# Patient Record
Sex: Female | Born: 1995
Health system: Southern US, Community
[De-identification: ages and names within clinical notes are randomized; demographics above are authoritative.]

## PROBLEM LIST (undated history)

## (undated) DIAGNOSIS — E079 Disorder of thyroid, unspecified: Secondary | ICD-10-CM

## (undated) DIAGNOSIS — F419 Anxiety disorder, unspecified: Secondary | ICD-10-CM

## (undated) DIAGNOSIS — F988 Other specified behavioral and emotional disorders with onset usually occurring in childhood and adolescence: Secondary | ICD-10-CM

## (undated) HISTORY — DX: Anxiety disorder, unspecified: F41.9

## (undated) HISTORY — PX: WISDOM TOOTH EXTRACTION: SHX21

## (undated) HISTORY — DX: Disorder of thyroid, unspecified: E07.9

## (undated) HISTORY — DX: Other specified behavioral and emotional disorders with onset usually occurring in childhood and adolescence: F98.8

---
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
## 2017-01-13 DIAGNOSIS — F4010 Social phobia, unspecified: Secondary | ICD-10-CM | POA: Diagnosis not present

## 2017-02-19 DIAGNOSIS — F4010 Social phobia, unspecified: Secondary | ICD-10-CM | POA: Diagnosis not present

## 2017-02-26 DIAGNOSIS — B354 Tinea corporis: Secondary | ICD-10-CM | POA: Diagnosis not present

## 2017-03-26 DIAGNOSIS — F4010 Social phobia, unspecified: Secondary | ICD-10-CM | POA: Diagnosis not present

## 2017-03-31 DIAGNOSIS — L818 Other specified disorders of pigmentation: Secondary | ICD-10-CM | POA: Diagnosis not present

## 2017-05-21 DIAGNOSIS — F4010 Social phobia, unspecified: Secondary | ICD-10-CM | POA: Diagnosis not present

## 2017-05-31 DIAGNOSIS — F4010 Social phobia, unspecified: Secondary | ICD-10-CM | POA: Diagnosis not present

## 2017-06-10 DIAGNOSIS — F4010 Social phobia, unspecified: Secondary | ICD-10-CM | POA: Diagnosis not present

## 2017-06-24 DIAGNOSIS — F4010 Social phobia, unspecified: Secondary | ICD-10-CM | POA: Diagnosis not present

## 2017-07-01 DIAGNOSIS — F4010 Social phobia, unspecified: Secondary | ICD-10-CM | POA: Diagnosis not present

## 2017-10-18 ENCOUNTER — Ambulatory Visit (INDEPENDENT_AMBULATORY_CARE_PROVIDER_SITE_OTHER): Payer: 59 | Admitting: Mental Health

## 2017-10-18 DIAGNOSIS — F411 Generalized anxiety disorder: Secondary | ICD-10-CM | POA: Diagnosis not present

## 2017-10-18 NOTE — Progress Notes (Signed)
      [facility] Counselor/Therapist Progress Note   Patient ID: Michaela Pena, MRN: [id number]  Date: 10/18/2017  Timespent: 58 minutes  Treatment Type: Individual  Subjective: Patient arrived on time for today's session.  She shared progress and events since last visit.  She states she was able to visit family in [place] and shared experiences.  She stated that she has struggled with decreasing her obsessive compulsive behavior while driving, which is to stop and check her gas tank at least 2 times on the way to work or other destinations.  We reviewed coping strategies to continue to decrease these behaviors.  Continue to assessed relevant history related to her anxiety.  Patient shared more history related to her relationship with her father.  She shared how he can get upset and gave an example recently in which she and her sister left his home.  She stated that they typically visit every Friday evening for dinner she stated that she plans to set some boundaries, not visit as often as she typically feels "on edge" while there.  Patient was able to process and identify the relationship between this relationship and her own levels of anxiety.  Interventions:CBT, Solution Focused and Strength-based  Mental Status Exam:   Appearance:   Casual     Behavior:  Appropriate  Motor:  Normal  Speech/Language:   Clear and Coherent  Affect:  Congruent  Mood:  euthymic  Thought process:  Coherent and Relevant  Thought content:    WDL  Perceptual disturbances:    Normal  Orientation:  Full (Time, Place, and Person)  Attention:  Good  Concentration:  good  Memory:  Immediate  Fund of knowledge:   Good  Insight:    Good  Judgment:   Good  Impulse Control:  good    Reported Symptoms: Daily anxiety, obsessive thoughts, compulsive behavior, panic attacks  Risk Assessment: Danger to Self:  No Self-injurious Behavior: No Danger to Others: No Duty to Warn:no Physical Aggression / Violence:No   Access to Firearms a concern: No  Gang Involvement:No   Diagnosis:   ICD-10-CM   1. Generalized anxiety disorder F41.1      Plan:   1.  Patient to continue to engage in individual counseling 2-4 times a month or as needed. 2.  Patient to identify and apply CBT, coping skills learned in session to decrease anxiety symptoms. 3.  Patient to contact this office, go to the local ED or call 911 if a crisis or emergency develops between visits.  Waldron Session, [facility]

## 2017-11-03 ENCOUNTER — Ambulatory Visit: Payer: 59 | Admitting: Mental Health

## 2017-11-03 DIAGNOSIS — F411 Generalized anxiety disorder: Secondary | ICD-10-CM

## 2017-11-03 NOTE — Progress Notes (Signed)
            [facility] Counselor/Therapist Progress Note   Patient ID: Michaela Pena, MRN: [id number]  Date: 11/03/2017  Timespent: 58 minutes  Treatment Type: Individual  Subjective: Patient arrived on time for today's session.  She shared progress and events since last visit.  She states she was able to visit family in [place] as planned.  She shared experiences with family which centered around their religious beliefs.  She went on to discuss the relationship she continues to have with her father.  She stated that they have not spoken about the incident that occurred his in his house about 2 weeks ago.  She shared she does not expect him to engage her in a discussion, as he often does not talk about relationship issues especially when they are has been any problems.  She shared her thoughts about setting boundaries with him we discussed ways she can communicate and share expectations.  She continues to struggle most days with checking behavior related to her car when driving.  She appears to have some motivation to change however, she appears to lack consistency.  We discussed other coping she employs in the car, how she does not turn on the radio for fear of not hearing something important.  Encouraged her to try other behaviors such as turning on the radio at a normal volume level to assist her in creating new behaviors and lower her anxiety.  She Interventions:CBT, Solution Focused and Strength-based  Mental Status Exam:   Appearance:   Casual     Behavior:  Appropriate  Motor:  Normal  Speech/Language:   Clear and Coherent  Affect:  Congruent  Mood:  euthymic  Thought process:  Coherent and Relevant  Thought content:    WDL  Perceptual disturbances:    Normal  Orientation:  Full (Time, Place, and Person)  Attention:  Good  Concentration:  good  Memory:  Immediate  Fund of knowledge:   Good  Insight:    Good  Judgment:   Good  Impulse Control:  good     Reported Symptoms: Daily anxiety, obsessive thoughts, compulsive behavior, panic attacks  Risk Assessment: Danger to Self:  No Self-injurious Behavior: No Danger to Others: No Duty to Warn:no Physical Aggression / Violence:No  Access to Firearms a concern: No  Gang Involvement:No   Diagnosis:   ICD-10-CM   1. Generalized anxiety disorder F41.1      Plan:   1.  Patient to continue to engage in individual counseling 2-4 times a month or as needed. 2.  Patient to identify and apply CBT, coping skills learned in session to decrease anxiety symptoms. 3.  Patient to contact this office, go to the local ED or call 911 if a crisis or emergency develops between visits.  Waldron Session, [facility]

## 2017-11-16 ENCOUNTER — Encounter: Payer: Self-pay | Admitting: Emergency Medicine

## 2017-11-16 DIAGNOSIS — F411 Generalized anxiety disorder: Secondary | ICD-10-CM

## 2017-11-17 ENCOUNTER — Ambulatory Visit: Payer: 59 | Admitting: Mental Health

## 2017-11-17 DIAGNOSIS — F411 Generalized anxiety disorder: Secondary | ICD-10-CM | POA: Diagnosis not present

## 2017-11-17 NOTE — Progress Notes (Signed)
     [facility] Counselor/Therapist Progress Note   Patient ID: Michaela Pena, MRN: [id number]  Date: 11/17/2017  Timespent: 58 minutes  Treatment Type: Individual  Mental Status Exam:   Appearance:   Casual     Behavior:  Appropriate  Motor:  Normal  Speech/Language:   Clear and Coherent  Affect:  Anxious, pleasant  Mood:  Full range, congruent  Thought process:  Coherent and Relevant, goal directed  Thought content:    WDL, no SI/HI  Perceptual disturbances:    none  Orientation:  Full (Time, Place, and Person)  Attention:  Good  Concentration:  good  Memory:  WNL  Fund of knowledge:   Good  Insight:    Good  Judgment:   Good  Impulse Control:  good    Reported Symptoms: Daily anxiety, obsessive thoughts, compulsive behavior, panic attacks  Risk Assessment: Danger to Self:  No Self-injurious Behavior: No Danger to Others: No Duty to Warn:no Physical Aggression / Violence:No  Access to Firearms a concern: No  Gang Involvement:No   Patient / guardian was educated about steps to take if suicide or homicide risk level increases between visits. While future psychiatric events cannot be accurately predicted, the patient does not currently require acute inpatient psychiatric care and does not currently meet [facility] involuntary commitment criteria.  Subjective: Patient arrived on time for today's session.  Discussed progress. She stated she has been doing well at school and work. She shared how she has not gone to her father's home recently.  She stated that she also does not plan to visit any time soon due to the recent issue that occurred when she and her sister were visiting him.  She stated that she continues to cope with anxiety particularly when driving as she continues to stop to check her gas cap At least 2 times on the trips.  We reviewed some past session content related to when she was away at college last year.  Some specifics around triggers for her  rising anxiety were explored.  She identified how she had minimal friends and was staying in her dorm room often, avoiding social contact.  Patient has difficulty in social situations but continues to provide minimal feedback regarding thoughts that occur during these anxiety producing moments.  Assisted her in identifying self talk.  Encouraged her to continue her homework assignments related to decreasing stopped times while driving.  We discussed the significance of her taking medications consistently as she stated that she does not take them nearly often as she should.  We discussed the potential gain of taking her medication to treat her anxiety and encouraged her to discuss with Corie Chiquito, NP at this practice at her next medication management visit.  Interventions: CBT, Solution Focused and Strength-based  Diagnosis:    ICD-10-CM   1. Generalized anxiety disorder F41.1     Plan:   1.  Patient to continue to engage in individual counseling 2-4 times a month or as needed. 2.  Patient to identify and apply CBT, coping skills learned in session to decrease anxiety symptoms. 3.  Patient to contact this office, go to the local ED or call 911 if a crisis or emergency develops between visits.  Waldron Session, [facility]

## 2017-11-29 ENCOUNTER — Ambulatory Visit: Payer: 59 | Admitting: Mental Health

## 2017-11-29 DIAGNOSIS — F411 Generalized anxiety disorder: Secondary | ICD-10-CM | POA: Diagnosis not present

## 2017-11-29 NOTE — Progress Notes (Signed)
     [facility] Counselor/Therapist Progress Note   Patient ID: Michaela Pena, MRN: [id number]  Date: 11/29/2017  Timespent: 58 minutes  Treatment Type: Individual  Mental Status Exam:   Appearance:   Casual     Behavior:  Appropriate  Motor:  Normal  Speech/Language:   Clear and Coherent  Affect:  Anxious, pleasant  Mood:  Full range, congruent  Thought process:  Coherent and Relevant, goal directed  Thought content:    WDL, no SI/HI  Perceptual disturbances:    none  Orientation:  Full (Time, Place, and Person)  Attention:  Good  Concentration:  good  Memory:  WNL  Fund of knowledge:   Good  Insight:    Good  Judgment:   Good  Impulse Control:  good    Reported Symptoms: Daily anxiety, obsessive thoughts, compulsive behavior, panic attacks  Risk Assessment: Danger to Self:  No Self-injurious Behavior: No Danger to Others: No Duty to Warn:no Physical Aggression / Violence:No  Access to Firearms a concern: No  Gang Involvement:No   Patient / guardian was educated about steps to take if suicide or homicide risk level increases between visits. While future psychiatric events cannot be accurately predicted, the patient does not currently require acute inpatient psychiatric care and does not currently meet [facility]  involuntary commitment criteria.  Subjective: Patient arrived on time for today's session.  Discussed progress.  She stated that she continues to struggle with her anxiety, continues compulsive behavior going over to check her gas At least 2 times per trip.  She stated that she has not attempted to work on homework exercises recently.  She appears to continue to avoid these efforts as her anxiety has persisted.  She has a coming appointment with Corie ChiquitoJessica Carter, NP at this practice.  We encouraged her to take her medication as prescribed as she continues to not be consistent with taking it.  We discussed ways to improve organization, taking at the same  time every day, setting reminders on her phone.  She stated she plans to begin increasing her organization after our discussion today.  She stated that she is willing to take her medication, but she forgets.  She identified other anxiety producing situations, riding elevators specifically.  She stated she takes the stairs often when she comes to her appointment due to it being on the fourth floor.  We discussed the possibility of her making an attempt to ride down following the session, however she declined.  Encouraged her to discuss any medication concerns or questions at her coming appointment.  Continue to work with her from a strength-based approach, support and encouragement were provided throughout as well as solution focused strategies reinforced.  Interventions: CBT, Solution Focused and Strength-based  Diagnosis:    ICD-10-CM   1. Generalized anxiety disorder F41.1     Plan:   1.  Patient to continue to engage in individual counseling 2-4 times a month or as needed. 2.  Patient to identify and apply CBT, coping skills learned in session to decrease anxiety symptoms. 3.  Patient to contact this office, go to the local ED or call 911 if a crisis or emergency develops between visits.  Waldron Sessionhristopher Marylen Zuk, [facility]

## 2017-12-03 ENCOUNTER — Ambulatory Visit: Payer: Self-pay | Admitting: Psychiatry

## 2017-12-13 ENCOUNTER — Ambulatory Visit: Payer: 59 | Admitting: Mental Health

## 2017-12-13 DIAGNOSIS — F411 Generalized anxiety disorder: Secondary | ICD-10-CM

## 2017-12-26 NOTE — Progress Notes (Signed)
     [facility] Counselor/Therapist Progress Note   Patient ID: Michaela Pena, MRN: [id number]  Date: 12/26/2017  Timespent: 58 minutes  Treatment Type: Individual  Mental Status Exam:   Appearance:   Casual     Behavior:  Appropriate  Motor:  Normal  Speech/Language:   Clear and Coherent  Affect:  Anxious, pleasant  Mood:  Full range, congruent  Thought process:  Coherent and Relevant, goal directed  Thought content:    WDL, no SI/HI  Perceptual disturbances:    none  Orientation:  Full (Time, Place, and Person)  Attention:  Good  Concentration:  good  Memory:  WNL  Fund of knowledge:   Good  Insight:    Good  Judgment:   Good  Impulse Control:  good    Reported Symptoms: Daily anxiety, obsessive thoughts, compulsive behavior, panic attacks  Risk Assessment: Danger to Self:  No Self-injurious Behavior: No Danger to Others: No Duty to Warn:no Physical Aggression / Violence:No  Access to Firearms a concern: No  Gang Involvement:No   Patient / guardian was educated about steps to take if suicide or homicide risk level increases between visits. While future psychiatric events cannot be accurately predicted, the patient does not currently require acute inpatient psychiatric care and does not [facility] meet [facility] involuntary [facility] criteria.  Subjective: Patient arrived on time for today's session.  Discussed progress over the last 2 weeks.  She shared how she spend time w/ [place]. She stated she saw her father, that they had some interactions but did not talk much, and not about their recent disagreement.  She continues to cope w/ anxiety most days, continues checking behaviors. Encouraged her to take her medications consistently, some problems solving was implemented due to her not having a clearly defined routine to take the medication and ultimately forgets. She expressed wanting to take the medication consistently in the  hope that it will ease some of her anxiety. Continued to work with her from a strength-based approach, support and encouragement were provided throughout as well as solution focused strategies reinforced.  Interventions: CBT, Solution Focused and Strength-based  Diagnosis:    ICD-10-CM   1. Generalized anxiety disorder F41.1     Plan:   1.  Patient to continue to engage in individual counseling 2-4 times a month or as needed. 2.  Patient to identify and apply CBT, coping skills learned in session to decrease anxiety symptoms. 3.  Patient to contact this office, go to the local ED or call 911 if a crisis or emergency develops between visits.  Waldron Sessionhristopher Annsley Akkerman, [facility]

## 2017-12-27 ENCOUNTER — Ambulatory Visit: Payer: 59 | Admitting: Psychiatry

## 2017-12-27 ENCOUNTER — Ambulatory Visit: Payer: 59 | Admitting: Mental Health

## 2017-12-27 ENCOUNTER — Encounter: Payer: Self-pay | Admitting: Psychiatry

## 2017-12-27 VITALS — BP 103/67 | HR 109

## 2017-12-27 DIAGNOSIS — F3342 Major depressive disorder, recurrent, in full remission: Secondary | ICD-10-CM

## 2017-12-27 DIAGNOSIS — F900 Attention-deficit hyperactivity disorder, predominantly inattentive type: Secondary | ICD-10-CM | POA: Diagnosis not present

## 2017-12-27 DIAGNOSIS — F411 Generalized anxiety disorder: Secondary | ICD-10-CM

## 2017-12-27 MED ORDER — AMPHETAMINE-DEXTROAMPHET ER 15 MG PO CP24: 15.0000 mg | ORAL_CAPSULE | ORAL | 0 refills | Status: DC

## 2017-12-27 MED ORDER — BUPROPION HCL ER (XL) 150 MG PO TB24: 150.0000 mg | ORAL_TABLET | ORAL | 1 refills | Status: DC

## 2017-12-27 MED ORDER — SERTRALINE HCL 100 MG PO TABS: 200.0000 mg | ORAL_TABLET | Freq: Every day | ORAL | 1 refills | Status: DC

## 2017-12-27 NOTE — Progress Notes (Signed)
     [facility] Counselor/Therapist Progress Note   Patient ID: Michaela Pena, MRN: [id number]  Date: 12/27/2017  Timespent: 56 minutes  Treatment Type: Individual  Mental Status Exam:   Appearance:   Casual     Behavior:  Appropriate  Motor:  Normal  Speech/Language:   Clear and Coherent  Affect:  Anxious, pleasant  Mood:  Full range, congruent  Thought process:  Coherent and Relevant, goal directed  Thought content:    WDL, no SI/HI  Perceptual disturbances:    none  Orientation:  Full (Time, Place, and Person)  Attention:  Good  Concentration:  good  Memory:  WNL  Fund of knowledge:   Good  Insight:    Good  Judgment:   Good  Impulse Control:  good    Reported Symptoms: Daily anxiety, obsessive thoughts, compulsive behavior, panic attacks  Risk Assessment: Danger to Self:  No Self-injurious Behavior: No Danger to Others: No Duty to Warn:no Physical Aggression / Violence:No  Access to Firearms a concern: No  Gang Involvement:No   Patient / guardian was educated about steps to take if suicide or homicide risk level increases between visits. While future psychiatric events cannot be accurately predicted, the patient does not currently require acute inpatient psychiatric care and does not currently meet [facility] involuntary commitment criteria.  Subjective: Patient arrived on time for today's session.  Discussed progress.  She stated that she continues to struggle with checking behavior while driving, having to stop at least 2 times on most of her trips occurring during the days.  Continue to challenge patient, gave her homework assignment to stop at her second stop only.  Discussed with her, questioning her rationale for stopping in which she struggled to validate.  She stated that she has taken her medications more often but continues to be inconsistent.  We reviewed ways to get organized for consistency for potential benefits.  She shared more history  related to her father, she stated that he will not call her unless she calls him which is typical for him and how he communicates with her sisters.  She shared how he may have had a more stressful childhood as his mother was minimally nurturing and very strict.   Continue to work with patient from a strength-based cognitive behavioral approach.  Interventions: CBT, Solution Focused and Strength-based  Diagnosis:    ICD-10-CM   1. Generalized anxiety disorder F41.1     Plan:   1.  Patient to continue to engage in individual counseling 2-4 times a month or as needed. 2.  Patient to identify and apply CBT, coping skills learned in session to decrease anxiety symptoms. 3.  Patient to contact this office, go to the local ED or call 911 if a crisis or emergency develops between visits.  Waldron Sessionhristopher Mandrell Vangilder, [facility]

## 2017-12-27 NOTE — Progress Notes (Signed)
Ryder Chesmore [id number] January 14, 1995 22 y.o.  Subjective:   Patient ID:  Michaela Pena is a 22 y.o. (DOB 1995/05/29) female.  Chief Complaint:  Chief Complaint  Patient presents with  . Follow-up    Anxiety, ADD, h/o depression    HPI Michaela Pena presents to the office today for follow-up of anxiety and depression. Reports that she has difficulty remembering to take her medications. She reports that medication is effective when she is taking it regularly. Reports that she has some anxiety and mild panic s/s due to acute stressors. Denies depressed mood. She reports that she continues to check things frequently and has not noticed a decrease in checking behaviors. Sleeping well. Denies change in appetite. Denies low energy or motivation. Reports concentration and focus have been "ok" and notice decreased concentration without Adderall XR. Reports that she has to give herself breaks to help with concentration. Denies SI.   Past Psychiatric Medication Trials: Wellbutrin XL Sertraline Adderall XR Adderall immediate release   Review of Systems:  Review of Systems  Cardiovascular: Negative for palpitations.  Musculoskeletal: Negative for gait problem.  Neurological: Negative for tremors.  Psychiatric/Behavioral:       Please refer to HPI    Medications: I have reviewed the patient's current medications.  Current Outpatient Medications  Medication Sig Dispense Refill  . buPROPion (WELLBUTRIN XL) 150 MG 24 hr tablet Take 1 tablet (150 mg total) by mouth every morning. 90 tablet 1  . cholecalciferol (VITAMIN D3) 25 MCG (1000 UT) tablet Take 1,000 Units by mouth daily.    . sertraline (ZOLOFT) 100 MG tablet Take 2 tablets (200 mg total) by mouth daily. 2 po qd 180 tablet 1  . amphetamine-dextroamphetamine (ADDERALL XR) 15 MG 24 hr capsule Take 1 capsule by mouth every morning. 30 capsule 0  . [START ON 01/24/2018] amphetamine-dextroamphetamine (ADDERALL XR)  15 MG 24 hr capsule Take 1 capsule by mouth every morning. 30 capsule 0  . [START ON 02/21/2018] amphetamine-dextroamphetamine (ADDERALL XR) 15 MG 24 hr capsule Take 1 capsule by mouth every morning. 30 capsule 0   No current facility-administered medications for this visit.     Medication Side Effects: None  Allergies: No Known Allergies  History reviewed. No pertinent past medical history.  Family History  Problem Relation Age of Onset  . Anxiety disorder Mother   . Depression Mother   . Anxiety disorder Maternal Grandmother   . Depression Maternal Grandmother     Social History   Socioeconomic History  . Marital status: Unknown    Spouse name: Not on file  . Number of children: Not on file  . Years of education: Not on file  . Highest education level: Not on file  Occupational History  . Not on file  Social Needs  . Financial resource strain: Not on file  . Food insecurity:    Worry: Not on file    Inability: Not on file  . Transportation needs:    Medical: Not on file    Non-medical: Not on file  Tobacco Use  . Smoking status: Never Smoker  . Smokeless tobacco: Never Used  Substance and Sexual Activity  . Alcohol use: Not on file  . Drug use: Not on file  . Sexual activity: Not on file  Lifestyle  . Physical activity:    Days per week: Not on file    Minutes per session: Not on file  . Stress: Not on file  Relationships  . Social connections:  Talks on phone: Not on file    Gets together: Not on file    Attends religious service: Not on file    Active member of club or organization: Not on file    Attends meetings of clubs or organizations: Not on file    Relationship status: Not on file  . Intimate partner violence:    Fear of current or ex partner: Not on file    Emotionally abused: Not on file    Physically abused: Not on file    Forced sexual activity: Not on file  Other Topics Concern  . Not on file  Social History Narrative  . Not on file     Past Medical History, Surgical history, Social history, and Family history were reviewed and updated as appropriate.   Please see review of systems for further details on the patient's review from today.   Objective:   Physical Exam:  BP 103/67   Pulse (!) 109   Physical Exam Constitutional:      General: She is not in acute distress.    Appearance: She is well-developed.  Musculoskeletal:        General: No deformity.  Neurological:     Mental Status: She is alert and oriented to person, place, and time.     Coordination: Coordination normal.  Psychiatric:        Mood and Affect: Mood is anxious. Mood is not depressed. Affect is not labile, blunt, angry or inappropriate.        Speech: Speech normal.        Behavior: Behavior normal.        Thought Content: Thought content normal. Thought content does not include homicidal or suicidal ideation. Thought content does not include homicidal or suicidal plan.        Judgment: Judgment normal.     Comments: Insight intact. No auditory or visual hallucinations. No delusions.      Lab Review:  No results found for: NA, K, CL, CO2, GLUCOSE, BUN, CREATININE, CALCIUM, PROT, ALBUMIN, AST, ALT, ALKPHOS, BILITOT, GFRNONAA, GFRAA  No results found for: WBC, RBC, HGB, HCT, PLT, MCV, MCH, MCHC, RDW, LYMPHSABS, MONOABS, EOSABS, BASOSABS  No results found for: POCLITH, LITHIUM   No results found for: PHENYTOIN, PHENOBARB, VALPROATE, CBMZ   .res Assessment: Plan:   Discussed barriers to taking medication consistently. Pt denies any side effects or issues with current medications and reports that she would like to continue current medications since they are effective for s/s when taken consistently. Suggested keeping medication available for when she realizes she has missed a dose.   Continue Adderall XR 15 mg po q am for ADD. Continue Wellbutrin XL 150 mg po q am for depression. Continue Sertraline 200 mg po qd for anxiety and  depression.  Attention deficit hyperactivity disorder (ADHD), predominantly inattentive type - Plan: amphetamine-dextroamphetamine (ADDERALL XR) 15 MG 24 hr capsule, amphetamine-dextroamphetamine (ADDERALL XR) 15 MG 24 hr capsule, amphetamine-dextroamphetamine (ADDERALL XR) 15 MG 24 hr capsule  Generalized anxiety disorder - Plan: sertraline (ZOLOFT) 100 MG tablet  Recurrent major depressive disorder, in full remission (HCC) - Plan: buPROPion (WELLBUTRIN XL) 150 MG 24 hr tablet, sertraline (ZOLOFT) 100 MG tablet  Please see After Visit Summary for patient specific instructions.  Future Appointments  Date Time Provider Department Center  01/10/2018  9:00 AM Waldron SessionAndrews, Christopher, LPC [facility] None  01/25/2018  2:00 PM Waldron SessionAndrews, Christopher, LPC [facility] None  03/28/2018  8:30 AM Corie Chiquitoarter, Jocsan Mcginley, PMHNP [facility] None  No orders of the defined types were placed in this encounter.     -------------------------------

## 2018-01-10 ENCOUNTER — Ambulatory Visit: Payer: 59 | Admitting: Mental Health

## 2018-01-10 DIAGNOSIS — F411 Generalized anxiety disorder: Secondary | ICD-10-CM

## 2018-01-10 NOTE — Progress Notes (Signed)
     [facility] Counselor/Therapist Progress Note   Patient ID: Michaela Pena, MRN: [id number]  Date: 01/10/2018  Timespent: 56 minutes  Treatment Type: Individual  Mental Status Exam:   Appearance:   Casual     Behavior:  Appropriate  Motor:  Normal  Speech/Language:   Clear and Coherent  Affect:  Anxious, pleasant  Mood:  Full range, congruent  Thought process:  Coherent and Relevant, goal directed  Thought content:    WDL, no SI/HI  Perceptual disturbances:    none  Orientation:  Full (Time, Place, and Person)  Attention:  Good  Concentration:  good  Memory:  WNL  Fund of knowledge:   Good  Insight:    Good  Judgment:   Good  Impulse Control:  good    Reported Symptoms: Daily anxiety, obsessive thoughts, compulsive behavior, panic attacks  Risk Assessment: Danger to Self:  No Self-injurious Behavior: No Danger to Others: No Duty to Warn:no Physical Aggression / Violence:No  Access to Firearms a concern: No  Gang Involvement:No   Patient / guardian was educated about steps to take if suicide or homicide risk level increases between visits. While future psychiatric events cannot be accurately predicted, the patient does not currently require acute inpatient psychiatric care and [facility] not [facility].  Subjective: Patient arrived on time for today's session.  Discussed progress since last visit.  She shared how she is on break from school and is also off work until after the new year.  We discussed progress with her checking behavior of her gas tank while driving.  She continues to check it, also states she is not taking her medication consistently.  She denies any reservations about taking her medication, denies any concerns.  Explored step-by-step the behaviors she has when driving that lead up to her checking her gas tank.  Targeted her changing 1 behavior specifically, which is sitting in her car for  approximately 1 minute prior to leaving and also prior to exiting the vehicle.  She agreed to not delay, not waiting a minute to begin driving or exiting the vehicle.  She shared how she feels she may learn this from her parents as they both do this behavior.  She does not have any reasons for this behavior.  She agreed to follow through with homework assignment given.   Interventions: CBT, Solution Focused and Strength-based  Diagnosis:    ICD-10-CM   1. Generalized anxiety disorder F41.1     Plan:   1.  Patient to continue to engage in individual counseling 2-4 times a month or as needed. 2.  Patient to identify and apply CBT, coping skills learned in session to decrease anxiety symptoms. 3.  Patient to contact this office, go to the local ED or call 911 if a crisis or emergency develops between visits.  Waldron Sessionhristopher Tesean Stump, [facility]

## 2018-01-25 ENCOUNTER — Ambulatory Visit: Payer: 59 | Admitting: Mental Health

## 2018-01-25 DIAGNOSIS — F411 Generalized anxiety disorder: Secondary | ICD-10-CM

## 2018-01-25 NOTE — Progress Notes (Signed)
     [facility] Counselor/Therapist Progress Note   Patient ID: Michaela Pena, MRN: [id number]  Date: 01/25/2018  Timespent: 53 minutes  Treatment Type: Individual  Mental Status Exam:   Appearance:   Casual     Behavior:  Appropriate  Motor:  Normal  Speech/Language:   Clear and Coherent  Affect:  Anxious, pleasant  Mood:  Full range, congruent  Thought process:  Coherent and Relevant, goal directed  Thought content:    WDL, no SI/HI  Perceptual disturbances:    none  Orientation:  Full (Time, Place, and Person)  Attention:  Good  Concentration:  good  Memory:  WNL  Fund of knowledge:   Good  Insight:    Good  Judgment:   Good  Impulse Control:  developing   Reported Symptoms: Daily anxiety, obsessive thoughts, compulsive behavior, panic attacks  Risk Assessment: Danger to Self:  No Self-injurious Behavior: No Danger to Others: No Duty to Warn:no Physical Aggression / Violence:No  Access to Firearms a concern: No  Gang Involvement:No   Patient / guardian was educated about steps to take if suicide or homicide risk level increases between visits. While future psychiatric events cannot be accurately predicted, the patient does not currently require acute inpatient psychiatric care and does not currently meet [facility] involuntary commitment criteria.   Subjective: Patient arrived on time for today's session.  Discussed progress since last visit.  She shared how she returned to school this semester; some worry about taking 5 classes. She does not have any reasons for this behavior.  She continues to have checking behavior, stopping in regard to check gas tank and other behaviors.  Continue to explore nature of patient's rationale for checking behavior.  We explored other examples which this behavior is not required when others are driving.  Patient had insight into how her behavior is not logical compulsive nonetheless.  She stated that she has been taking her  medication "most of the time".  Encouraged her to continue and to reach out with any questions or concerns.  We discussed the potential significance of this component of her treatment as she endorsed anxiety most days at significant levels. She agreed to follow through with homework assignment given.   Interventions: CBT, Solution Focused and Strength-based  Diagnosis:    ICD-10-CM   1. Generalized anxiety disorder F41.1     Plan:   1.  Patient to continue to engage in individual counseling 2-4 times a month or as needed. 2.  Patient to identify and apply CBT, coping skills learned in session to decrease anxiety symptoms. 3.  Patient to contact this office, go to the local ED or call 911 if a crisis or emergency develops between visits.  Waldron Session, [facility]

## 2018-02-08 ENCOUNTER — Ambulatory Visit: Payer: 59 | Admitting: Mental Health

## 2018-02-08 DIAGNOSIS — F411 Generalized anxiety disorder: Secondary | ICD-10-CM | POA: Diagnosis not present

## 2018-02-21 NOTE — Progress Notes (Signed)
     [facility] Counselor/Therapist Progress Note   Patient ID: Kenzey Kreis, MRN: [id number]  Date: 02/08/18  Timespent: 58 minutes  Treatment Type: Individual  Mental Status Exam:   Appearance:   Casual     Behavior:  Appropriate  Motor:  Normal  Speech/Language:   Clear and Coherent  Affect:  Anxious, pleasant  Mood:  Full range, congruent  Thought process:  Coherent and Relevant, goal directed  Thought content:    WDL, no SI/HI  Perceptual disturbances:    none  Orientation:  Full (Time, Place, and Person)  Attention:  Good  Concentration:  good  Memory:  WNL  Fund of knowledge:   Good  Insight:    Good  Judgment:   Good  Impulse Control:  developing   Reported Symptoms: Daily anxiety, obsessive thoughts, compulsive behavior, panic attacks  Risk Assessment: Danger to Self:  No Self-injurious Behavior: No Danger to Others: No Duty to Warn:no Physical Aggression / Violence:No  Access to Firearms a concern: No  Gang Involvement:No   Patient / guardian was educated about steps to take if suicide or homicide risk level increases between visits. While future psychiatric events cannot be accurately predicted, the patient does not currently require acute inpatient psychiatric care and does not currently meet [facility] involuntary commitment criteria.   Subjective: Patient arrived on time for today's session. Discussed progress. Patient said she continues to do well in school and has no issues at work. She continues to function well, however, continues compulsive behaviors while driving. Through discussion, she was somewhat able to understand how her constant listening for possible engine problems in her car or any car that she drives is outside the normative range of behavior. She admitted that she would become emotional, filled with panic if she did not stop to check her gas tank that she has been doing consistently for some time. we discuss utilizing music as  a coping tool while driving, listening at a low volume. Patient has a level of resistance and making any changes, but acknowledges she wants her anxiety and subsequent behaviors to diminish. Encouraged her to continue to take her medications as prescribed as she has struggled with this recently. Encourage her to also report any concerns by contacting our office between visits.  She agreed to follow through with homework assignment given.   Interventions: CBT, Solution Focused and Strength-based  Diagnosis:    ICD-10-CM   1. Generalized anxiety disorder F41.1     Plan:   1.  Patient to continue to engage in individual counseling 2-4 times a month or as needed. 2.  Patient to identify and apply CBT, coping skills learned in session to decrease anxiety symptoms. 3.  Patient to contact this office, go to the local ED or call 911 if a crisis or emergency develops between visits.  Waldron Session, [facility]

## 2018-02-22 ENCOUNTER — Ambulatory Visit: Payer: 59 | Admitting: Mental Health

## 2018-02-22 DIAGNOSIS — F411 Generalized anxiety disorder: Secondary | ICD-10-CM | POA: Diagnosis not present

## 2018-03-06 NOTE — Progress Notes (Signed)
     [facility] Counselor/Therapist Progress Note   Patient ID: Michaela Pena, MRN: [id number]  Date: 02/22/18  Timespent: 59 minutes  Treatment Type: Individual  Mental Status Exam:   Appearance:   Casual     Behavior:  Appropriate  Motor:  Normal  Speech/Language:   Clear and Coherent  Affect:  Anxious, pleasant  Mood:  Full range, congruent  Thought process:  Coherent and Relevant, goal directed  Thought content:    WDL, no SI/HI  Perceptual disturbances:    none  Orientation:  Full (Time, Place, and Person)  Attention:  Good  Concentration:  good  Memory:  WNL  Fund of knowledge:   Good  Insight:    Good  Judgment:   Good  Impulse Control:  developing   Reported Symptoms: Daily anxiety, obsessive thoughts, compulsive behavior, panic attacks  Risk Assessment: Danger to Self:  No Self-injurious Behavior: No Danger to Others: No Duty to Warn:no Physical Aggression / Violence:No  Access to Firearms a concern: No  Gang Involvement:No   Patient / guardian was educated about steps to take if suicide or homicide risk level increases between visits. While future psychiatric events cannot be accurately predicted, the patient does not currently require acute inpatient psychiatric care and does not currently meet [facility] involuntary commitment criteria.   Subjective: Patient arrived on time for today's session. Patient standing she's been doing well, report she is academically maintaining High grades in college. She shared how she continues to struggle with her anxiety, having frequent stops on the road do to having to check her gas cap; this compulsive behavior process. We were viewed potential coping strategies continuing to work with patient for my cognitive behavioral framework. Discussed potentially listening to music in her car at a low level as she stated she I've never done this and intently focuses on the road to maintain safety. Patient has many safety  behaviors before during and after driving along with other behaviors separate from driving vehicles. Patient was receptive to continuing to consider the possibility of utilizing some of the coping mechanisms discussed in the sessions. Continue to work with patient from a cognitive behavioral framework, strengths-based approaches.  Interventions: CBT, Solution Focused and Strength-based  Diagnosis:    ICD-10-CM   1. Generalized anxiety disorder F41.1     Plan:   1.  Patient to continue to engage in individual counseling 2-4 times a month or as needed. 2.  Patient to identify and apply CBT, coping skills learned in session to decrease anxiety symptoms. 3.  Patient to contact this office, go to the local ED or call 911 if a crisis or emergency develops between visits.  Waldron Session, [facility]

## 2018-03-08 ENCOUNTER — Ambulatory Visit: Payer: 59 | Admitting: Mental Health

## 2018-03-08 DIAGNOSIS — F411 Generalized anxiety disorder: Secondary | ICD-10-CM

## 2018-03-08 NOTE — Progress Notes (Signed)
     [facility] Counselor/Therapist Progress Note   Patient ID: Michaela Pena, MRN: [id number]  Date: 03/08/18  Timespent: 61 minutes  Treatment Type: Individual  Mental Status Exam:   Appearance:   Casual     Behavior:  Appropriate  Motor:  Normal  Speech/Language:   Clear and Coherent  Affect:  Anxious, pleasant  Mood:  Full range, congruent  Thought process:  Coherent and Relevant, goal directed  Thought content:    WDL, no SI/HI  Perceptual disturbances:    none  Orientation:  Full (Time, Place, and Person)  Attention:  Good  Concentration:  good  Memory:  WNL  Fund of knowledge:   Good  Insight:    Good  Judgment:   Good  Impulse Control:  developing   Reported Symptoms: Daily anxiety, obsessive thoughts, compulsive behavior, panic attacks  Risk Assessment: Danger to Self:  No Self-injurious Behavior: No Danger to Others: No Duty to Warn:no Physical Aggression / Violence:No  Access to Firearms a concern: No  Gang Involvement:No   Patient / guardian was educated about steps to take if suicide or homicide risk level increases between visits. While future psychiatric events cannot be accurately predicted, the patient does not currently require acute inpatient psychiatric care and does not currently meet [facility] involuntary commitment criteria.   Subjective: Patient arrived on time for today's session.  She continues to cope with daily anxiety, obsessive behavior related to stopping while driving to check her gas tank continues.  We explore alternative coping, continuing to explore with patient effective coping styles.  She was agreeable to continue to attempt to listen to some music at a low time while driving and she continues to have anxiety, ruminates about her car having a malfunction.  Assisted her in reframing and examining some automatic negative self talk.  Encouraged her to continue coping identified during session between sessions.  Continue to  work with patient from a cognitive behavioral framework, strengths-based approaches.  Interventions: CBT, Solution Focused and Strength-based  Diagnosis:    ICD-10-CM   1. Generalized anxiety disorder F41.1     Plan:   1.  Patient to continue to engage in individual counseling 2-4 times a month or as needed. 2.  Patient to identify and apply CBT, coping skills learned in session to decrease anxiety symptoms. 3.  Patient to contact this office, go to the local ED or call 911 if a crisis or emergency develops between visits.  Waldron Session, [facility]

## 2018-03-22 ENCOUNTER — Ambulatory Visit: Payer: 59 | Admitting: Mental Health

## 2018-03-22 DIAGNOSIS — F411 Generalized anxiety disorder: Secondary | ICD-10-CM

## 2018-03-22 NOTE — Progress Notes (Signed)
     [facility] Counselor/Therapist Progress Note   Patient ID: Michaela Pena, MRN: [id number]  Date: 02/22/18  Timespent: 59 minutes  Treatment Type: Individual  Mental Status Exam:   Appearance:   Casual     Behavior:  Appropriate  Motor:  Normal  Speech/Language:   Clear and Coherent  Affect:  Anxious, pleasant  Mood:  Full range, congruent  Thought process:  Coherent and Relevant, goal directed  Thought content:    WDL, no SI/HI  Perceptual disturbances:    none  Orientation:  Full (Time, Place, and Person)  Attention:  Good  Concentration:  good  Memory:  WNL  Fund of knowledge:   Good  Insight:    Good  Judgment:   Good  Impulse Control:  developing   Reported Symptoms: Daily anxiety, obsessive thoughts, compulsive behavior, panic attacks  Risk Assessment: Danger to Self:  No Self-injurious Behavior: No Danger to Others: No Duty to Warn:no Physical Aggression / Violence:No  Access to Firearms a concern: No  Gang Involvement:No   Patient / guardian was educated about steps to take if suicide or homicide risk level increases between visits. While future psychiatric events cannot be accurately predicted, the patient does not currently require acute inpatient psychiatric care and does not currently meet [facility] involuntary commitment criteria.   Subjective: Patient arrived on time for today's session.  Patient stated that she has not been able to take steps towards decreasing some of her obsessive-compulsive behaviors related to driving between sessions.  Explored with patient levels of her motivation for change in this area.  Patient identified wanting change, however lacking follow-through due to behavior becoming "normal" over time.  Encouraged patient to start [place] the boundaries she has set with her self related to these behaviors setting small measurable goals with increase consistency.  Continue to discuss family relationships specifically with  that of her father.  They continue not to speak with 1 another.  More information was shared related to this relationship and his lack of nurturing and support over the years to her and her sisters.  Patient also identifies some specific issues where she experienced a lack of support specifically from home.  We discussed the potential significance of writing him a letter and/or journaling a letter for the benefit of identifying and processing potentially suppressed feelings.  Patient was receptive to continuing to consider the possibility of utilizing some of the coping mechanisms discussed in the sessions. Continue to work with patient from a cognitive behavioral framework, strengths-based approaches.  Interventions: CBT, Solution Focused and Strength-based  Diagnosis:    ICD-10-CM   1. Generalized anxiety disorder F41.1     Plan:   1.  Patient to continue to engage in individual counseling 2-4 times a month or as needed. 2.  Patient to identify and apply CBT, coping skills learned in session to decrease anxiety symptoms. 3.  Patient to contact this office, go to the local ED or call 911 if a crisis or emergency develops between visits.  Waldron Session, [facility]

## 2018-03-28 ENCOUNTER — Encounter: Payer: Self-pay | Admitting: Psychiatry

## 2018-03-28 ENCOUNTER — Other Ambulatory Visit: Payer: Self-pay

## 2018-03-28 ENCOUNTER — Ambulatory Visit: Payer: 59 | Admitting: Psychiatry

## 2018-03-28 DIAGNOSIS — F411 Generalized anxiety disorder: Secondary | ICD-10-CM

## 2018-03-28 DIAGNOSIS — F3342 Major depressive disorder, recurrent, in full remission: Secondary | ICD-10-CM

## 2018-03-28 DIAGNOSIS — F900 Attention-deficit hyperactivity disorder, predominantly inattentive type: Secondary | ICD-10-CM

## 2018-03-28 MED ORDER — SERTRALINE HCL 100 MG PO TABS: 200.0000 mg | ORAL_TABLET | Freq: Every day | ORAL | 1 refills | Status: DC

## 2018-03-28 MED ORDER — BUPROPION HCL ER (XL) 150 MG PO TB24: 150.0000 mg | ORAL_TABLET | ORAL | 1 refills | Status: DC

## 2018-03-28 NOTE — Progress Notes (Signed)
Azaelia Kincannon [id number] July 30, 1995 22 y.o.  Subjective:   Patient ID:  Michaela Pena is a 23 y.o. (DOB Jun 02, 1995) female.  Chief Complaint:  Chief Complaint  Patient presents with  . Follow-up    Anxiety, ADD, h/o Depression    HPI Michaela Pena presents to the office today for follow-up of ADD and anxiety. She reports that anxiety has improved some and is manageable. Denies any recent panic s/s. Reports some continued checking behaviors. Reports that she rarely takes Adderall due to stomach upset, maybe once every 2 months.  She reports that her concentration is ok and inattention is manageable and not causing any difficulties. Describes mood as "ok" and denies depressed mood. She reports that her sleep has been adequate and appetite has been stable. She reports energy and motivation have been good. Denies SI.    Past Psychiatric Medication Trials: Wellbutrin XL Sertraline Adderall XR-effective but causes stomach upset.  Adderall immediate release- Seems to be better tolerated  Review of Systems:  Review of Systems  Musculoskeletal: Negative for gait problem.  Neurological: Negative for tremors.  Psychiatric/Behavioral:       Please refer to HPI    Medications: I have reviewed the patient's current medications.  Current Outpatient Medications  Medication Sig Dispense Refill  . amphetamine-dextroamphetamine (ADDERALL XR) 15 MG 24 hr capsule Take 1 capsule by mouth every morning. 30 capsule 0  . amphetamine-dextroamphetamine (ADDERALL XR) 15 MG 24 hr capsule Take 1 capsule by mouth every morning. 30 capsule 0  . amphetamine-dextroamphetamine (ADDERALL XR) 15 MG 24 hr capsule Take 1 capsule by mouth every morning. 30 capsule 0  . buPROPion (WELLBUTRIN XL) 150 MG 24 hr tablet Take 1 tablet (150 mg total) by mouth every morning. 90 tablet 1  . cholecalciferol (VITAMIN D3) 25 MCG (1000 UT) tablet Take 1,000 Units by mouth daily.    . sertraline  (ZOLOFT) 100 MG tablet Take 2 tablets (200 mg total) by mouth daily. 2 po qd 180 tablet 1   No current facility-administered medications for this visit.     Medication Side Effects: None  Allergies: No Known Allergies  History reviewed. No pertinent past medical history.  Family History  Problem Relation Age of Onset  . Anxiety disorder Mother   . Depression Mother   . Anxiety disorder Maternal Grandmother   . Depression Maternal Grandmother     Social History   Socioeconomic History  . Marital status: Unknown    Spouse name: Not on file  . Number of children: Not on file  . Years of education: Not on file  . Highest education level: Not on file  Occupational History  . Not on file  Social Needs  . Financial resource strain: Not on file  . Food insecurity:    Worry: Not on file    Inability: Not on file  . Transportation needs:    Medical: Not on file    Non-medical: Not on file  Tobacco Use  . Smoking status: Never Smoker  . Smokeless tobacco: Never Used  Substance and Sexual Activity  . Alcohol use: Not on file  . Drug use: Not on file  . Sexual activity: Not on file  Lifestyle  . Physical activity:    Days per week: Not on file    Minutes per session: Not on file  . Stress: Not on file  Relationships  . Social connections:    Talks on phone: Not on file    Gets together:  Not on file    Attends religious service: Not on file    Active member of club or organization: Not on file    Attends meetings of clubs or organizations: Not on file    Relationship status: Not on file  . Intimate partner violence:    Fear of current or ex partner: Not on file    Emotionally abused: Not on file    Physically abused: Not on file    Forced sexual activity: Not on file  Other Topics Concern  . Not on file  Social History Narrative  . Not on file    Past Medical History, Surgical history, Social history, and Family history were reviewed and updated as appropriate.    Please see review of systems for further details on the patient's review from today.   Objective:   Physical Exam:  There were no vitals taken for this visit.  Physical Exam Constitutional:      General: She is not in acute distress.    Appearance: She is well-developed.  Musculoskeletal:        General: No deformity.  Neurological:     Mental Status: She is alert and oriented to person, place, and time.     Coordination: Coordination normal.  Psychiatric:        Attention and Perception: Attention and perception normal. She does not perceive auditory or visual hallucinations.        Mood and Affect: Mood is anxious. Mood is not depressed. Affect is blunt. Affect is not labile, angry or inappropriate.        Speech: Speech normal.        Behavior: Behavior normal.        Thought Content: Thought content normal. Thought content does not include homicidal or suicidal ideation. Thought content does not include homicidal or suicidal plan.        Cognition and Memory: Cognition and memory normal.        Judgment: Judgment normal.     Comments: Insight intact. No delusions.      Lab Review:  No results found for: NA, K, CL, CO2, GLUCOSE, BUN, CREATININE, CALCIUM, PROT, ALBUMIN, AST, ALT, ALKPHOS, BILITOT, GFRNONAA, GFRAA  No results found for: WBC, RBC, HGB, HCT, PLT, MCV, MCH, MCHC, RDW, LYMPHSABS, MONOABS, EOSABS, BASOSABS  No results found for: POCLITH, LITHIUM   No results found for: PHENYTOIN, PHENOBARB, VALPROATE, CBMZ   .res Assessment: Plan:   Pt reports that she would like to continue current medications since mood and anxiety s/s have improved with medications and she is not experiencing any tolerability issues. Discussed that alternative stimulants could be tried if ADD s/s become unmanageable since pt reports GI side effects with Adderall. Pt agrees and reports that she will continue to take Adderall XR prn on infrequent occasions when needed. She reports that she  does not need a prescription at this time.  Continue Sertraline 200 mg po qd for anxiety and depressive s/s. Continue Wellbutrin XL 150 mg po qd for depression.  Recommend continuing psychotherapy with Elio Forget, LPC. Generalized anxiety disorder - Plan: sertraline (ZOLOFT) 100 MG tablet  Recurrent major depressive disorder, in full remission (HCC) - Plan: buPROPion (WELLBUTRIN XL) 150 MG 24 hr tablet, sertraline (ZOLOFT) 100 MG tablet  Attention deficit hyperactivity disorder (ADHD), predominantly inattentive type  Please see After Visit Summary for patient specific instructions.  Future Appointments  Date Time Provider Department Center  04/05/2018  1:00 PM Waldron Session, [facility] CP-[facility] None  04/26/2018  9:00 AM Waldron Session, [facility] CP-CP None  05/12/2018  1:00 PM Waldron Session, [facility] CP-CP None  05/24/2018  1:00 PM Waldron Session, [facility] CP-CP None  09/28/2018  8:30 AM Corie Chiquito, PMHNP CP-CP None    No orders of the defined types were placed in this encounter.     -------------------------------

## 2018-04-05 ENCOUNTER — Ambulatory Visit: Payer: 59 | Admitting: Mental Health

## 2018-04-26 ENCOUNTER — Ambulatory Visit: Payer: 59 | Admitting: Mental Health

## 2018-05-03 ENCOUNTER — Ambulatory Visit (INDEPENDENT_AMBULATORY_CARE_PROVIDER_SITE_OTHER): Payer: 59 | Admitting: Mental Health

## 2018-05-03 ENCOUNTER — Other Ambulatory Visit: Payer: Self-pay

## 2018-05-03 DIAGNOSIS — F411 Generalized anxiety disorder: Secondary | ICD-10-CM | POA: Diagnosis not present

## 2018-05-03 NOTE — Progress Notes (Signed)
[facility] Counselor/Therapist Progress Note   Patient ID: Michaela Pena, MRN: [id number]  Date: 05/03/18  Timespent: 47 minutes  Treatment Type: Individual   Virtual Visit via Telephone Note Connected with patient by a video enabled telemedicine/telehealth application or telephone, with their informed consent, and verified patient privacy and that I am speaking with the correct person using two identifiers. I discussed the limitations, risks, security and privacy concerns of performing psychotherapy and management service by telephone and the availability of in person appointments. I also discussed with the patient that there may be a patient responsible charge related to this service. The patient expressed understanding and agreed to proceed. I discussed the treatment planning with the patient. The patient was provided an opportunity to ask questions and all were answered. The patient agreed with the plan and demonstrated an understanding of the instructions. The patient was advised to call  our office if  symptoms worsen or feel they are in a crisis state and need immediate contact.  Therapist Location: home Patient Location: home  Start time: 9:00am Stop time: 9:47am  Mental Status Exam:   Appearance:   Casual     Behavior:  Appropriate  Motor:  Normal  Speech/Language:   Clear and Coherent  Affect:  Anxious, pleasant  Mood:  Full range, congruent  Thought process:  Coherent and Relevant, goal directed  Thought content:    WDL, no SI/HI  Perceptual disturbances:    none  Orientation:  Full (Time, Place, and Person)  Attention:  Good  Concentration:  good  Memory:  WNL  Fund of knowledge:   Good  Insight:    Good  Judgment:   Good  Impulse Control:  developing   Reported Symptoms: Daily anxiety, obsessive thoughts, compulsive behavior, panic attacks  Risk Assessment: Danger to Self:  No Self-injurious Behavior: No Danger to Others: No Duty to  Warn:no Physical Aggression / Violence:No  Access to Firearms a concern: No  Gang Involvement:No   Patient / guardian was educated about steps to take if suicide or homicide risk level increases between visits. While future psychiatric events cannot be accurately predicted, the patient does not currently require acute inpatient psychiatric care and does not [facility] criteria.  Subjective: Patient engaged in teletherapy session.  Discussed progress since her last session which was over a month ago.  She shared how she has coped during the viral pandemic.  She stated that she is working from home but has reduced work hours.  She continues to maintain academic progress via online classes to complete her college degree.  She continues to endorse levels of anxiety, somewhat lower due to reduced driving as this area is where her anxiety is highest on most days.  She shared family interactions, specifically with grandparents and their efforts towards wanting her to be married.  Allow patient to fully process family related issues as well as how her father has not contacted her in the last several months.  Provide support, understanding.  Patient was receptive to continuing to consider the possibility of utilizing some of the coping mechanisms discussed in previous sessions. Continue to work with patient from a cognitive behavioral framework, strengths-based approaches.  Interventions: CBT, Solution Focused and Strength-based  Diagnosis:    ICD-10-CM   1. Generalized anxiety disorder F41.1    Plan:   1.  Patient to continue to engage in individual counseling 2-4 times a month or as needed. 2.  Patient to identify  and apply CBT, coping skills learned in session to decrease anxiety symptoms. 3.  Patient to contact this office, go to the local ED or call 911 if a crisis or emergency develops between visits.  Waldron Session, [facility]

## 2018-05-12 ENCOUNTER — Ambulatory Visit (INDEPENDENT_AMBULATORY_CARE_PROVIDER_SITE_OTHER): Payer: 59 | Admitting: Mental Health

## 2018-05-12 ENCOUNTER — Other Ambulatory Visit: Payer: Self-pay

## 2018-05-12 DIAGNOSIS — F411 Generalized anxiety disorder: Secondary | ICD-10-CM

## 2018-05-12 NOTE — Progress Notes (Signed)
[facility] Counselor/Therapist Progress Note   Patient ID: Michaela Pena, MRN: [id number]  Date: 05/12/18  Timespent: 54 minutes  Treatment Type: Individual   Virtual Visit via Telephone Note Connected with patient by a video enabled telemedicine/telehealth application or telephone, with their informed consent, and verified patient privacy and that I am speaking with the correct person using two identifiers. I discussed the limitations, risks, security and privacy concerns of performing psychotherapy and management service by telephone and the availability of in person appointments. I also discussed with the patient that there may be a patient responsible charge related to this service. The patient expressed understanding and agreed to proceed. I discussed the treatment planning with the patient. The patient was provided an opportunity to ask questions and all were answered. The patient agreed with the plan and demonstrated an understanding of the instructions. The patient was advised to call  our office if  symptoms worsen or feel they are in a crisis state and need immediate contact.  Therapist Location: home Patient Location: home  Mental Status Exam:   Appearance:   Casual     Behavior:  Appropriate  Motor:  Normal  Speech/Language:   Clear and Coherent  Affect:  Anxious, pleasant  Mood:  Full range, congruent  Thought process:  Coherent and Relevant, goal directed  Thought content:    WDL, no SI/HI  Perceptual disturbances:    none  Orientation:  Full (Time, Place, and Person)  Attention:  Good  Concentration:  good  Memory:  WNL  Fund of knowledge:   Good  Insight:    Good  Judgment:   Good  Impulse Control:  developing   Reported Symptoms: Daily anxiety, obsessive thoughts, compulsive behavior, panic attacks  Risk Assessment: Danger to Self:  No Self-injurious Behavior: No Danger to Others: No Duty to Warn:no Physical Aggression / Violence:No  Access  to Firearms a concern: No  Gang Involvement:No   Patient / guardian was educated about steps to take if suicide or homicide risk level increases between visits. While future psychiatric events cannot be accurately predicted, the patient does not currently require acute inpatient psychiatric care and does not currently meet [facility] criteria.  Subjective: Patient engaged in teletherapy session. She shared how her grandparents continue to set her up on dates.  She does not have the same religious believes and continues to pacify them in this process.  She expressed how she is hopeful that after her birthday they may decrease some pressure around her having to be married.  She shared how she has lost some more hours at work but is not stressed financially.  She continues college completing spring quarter.  Has support from her mother and her sisters 1 of which is home from college.  Ways she has been able to find time alone to destress from typical family day-to-day issues were shared.  Anxiety has been lower due to not having to drive often.  Discussed the possibility of her anxiety being somewhat different related to driving when that time comes.  Encouraged her to reflect on some past experiences utilizing some cognitive concepts discussed in previous sessions to lower anxiety producing thoughts.  Continued to work with patient from a cognitive behavioral framework, strengths-based approaches.  Interventions: CBT, Solution Focused and Strength-based  Diagnosis:    ICD-10-CM   1. Generalized anxiety disorder F41.1    Plan:   1.  Patient to continue to engage in individual counseling 2-4 times  a month or as needed. 2.  Patient to identify and apply CBT, coping skills learned in session to decrease anxiety symptoms. 3.  Patient to contact this office, go to the local ED or call 911 if a crisis or emergency develops between visits.  Waldron Session, [facility]

## 2018-05-24 ENCOUNTER — Ambulatory Visit (INDEPENDENT_AMBULATORY_CARE_PROVIDER_SITE_OTHER): Payer: 59 | Admitting: Mental Health

## 2018-05-24 ENCOUNTER — Other Ambulatory Visit: Payer: Self-pay

## 2018-05-24 DIAGNOSIS — F411 Generalized anxiety disorder: Secondary | ICD-10-CM | POA: Diagnosis not present

## 2018-05-24 NOTE — Progress Notes (Signed)
     [facility] Counselor/Therapist Progress Note   Patient ID: Arrington Krebsbach, MRN: [id number]  Date: 05/24/18  Timespent: 55 minutes  Treatment Type: Individual   Virtual Visit via Telephone Note Connected with patient by a video enabled telemedicine/telehealth application or telephone, with their informed consent, and verified patient privacy and that I am speaking with the correct person using two identifiers. I discussed the limitations, risks, security and privacy concerns of performing psychotherapy and management service by telephone and the availability of in person appointments. I also discussed with the patient that there may be a patient responsible charge related to this service. The patient expressed understanding and agreed to proceed. I discussed the treatment planning with the patient. The patient was provided an opportunity to ask questions and all were answered. The patient agreed with the plan and demonstrated an understanding of the instructions. The patient was advised to call  our office if  symptoms worsen or feel they are in a crisis state and need immediate contact.  Therapist Location: home Patient Location: home  Mental Status Exam:   Appearance:   Casual     Behavior:  Appropriate  Motor:  Normal  Speech/Language:   Clear and Coherent  Affect:  Anxious, pleasant  Mood:  Full range, congruent  Thought process:  Coherent and Relevant, goal directed  Thought content:    WDL, no SI/HI  Perceptual disturbances:    none  Orientation:  Full (Time, Place, and Person)  Attention:  Good  Concentration:  good  Memory:  WNL  Fund of knowledge:   Good  Insight:    Good  Judgment:   Good  Impulse Control:  developing   Reported Symptoms: Daily anxiety, obsessive thoughts, compulsive behavior, panic attacks  Risk Assessment: Danger to Self:  No Self-injurious Behavior: No Danger to Others: No Duty to Warn:no Physical Aggression / Violence:No  Access  to Firearms a concern: No  Gang Involvement:No   Patient / guardian was educated about steps to take if suicide or homicide risk level increases between visits. While future psychiatric events cannot be accurately predicted, the patient does not currently require acute inpatient psychiatric care and does not currently meet [facility] involuntary commitment criteria.  Subjective: Patient engaged in teletherapy session. She shared recent experiences, more video dates related to family influence towards marriage. Not stressful for patient, has gotten used to the process. Was able to visit her grM who is in a memory care facility. Pt shared her level of functioning, which appears to be more severely impaired. Continues to work part time, completed Spring quarter.  Reviewed coping towards finding time alone to de-stress and get outside when she can.  Went diving recently, stated she stopped at her usual stops.  Reviewed cognitive concepts discussed in previous sessions to lower anxiety producing thoughts.  Continued to work with patient from a cognitive behavioral framework, strengths-based approaches.  Interventions: CBT, Solution Focused and Strength-based  Diagnosis:    ICD-10-CM   1. Generalized anxiety disorder F41.1    Plan:   1.  Patient to continue to engage in individual counseling 2-4 times a month or as needed. 2.  Patient to identify and apply CBT, coping skills learned in session to decrease anxiety symptoms. 3.  Patient to contact this office, go to the local ED or call 911 if a crisis or emergency develops between visits.  Waldron Session, [facility]

## 2018-06-07 ENCOUNTER — Ambulatory Visit (INDEPENDENT_AMBULATORY_CARE_PROVIDER_SITE_OTHER): Payer: 59 | Admitting: Mental Health

## 2018-06-07 ENCOUNTER — Other Ambulatory Visit: Payer: Self-pay

## 2018-06-07 DIAGNOSIS — F411 Generalized anxiety disorder: Secondary | ICD-10-CM | POA: Diagnosis not present

## 2018-06-07 DIAGNOSIS — F900 Attention-deficit hyperactivity disorder, predominantly inattentive type: Secondary | ICD-10-CM

## 2018-06-07 NOTE — Progress Notes (Signed)
[facility] Counselor/Therapist Progress Note   Patient ID: Michaela Pena Michaela Pena, MRN: [id number]  Date: 06/07/18  Timespent: 54 minutes  Treatment Type: Individual   Virtual Visit via Telephone Note Connected with patient by a video enabled telemedicine/telehealth application or telephone, with their informed consent, and verified patient privacy and that I am speaking with the correct person using two identifiers. I discussed the limitations, risks, security and privacy concerns of performing psychotherapy and management service by telephone and the availability of in person appointments. I also discussed with the patient that there may be a patient responsible charge related to this service. The patient expressed understanding and agreed to proceed. I discussed the treatment planning with the patient. The patient was provided an opportunity to ask questions and all were answered. The patient agreed with the plan and demonstrated an understanding of the instructions. The patient was advised to call  our office if  symptoms worsen or feel they are in a crisis state and need immediate contact.  Therapist Location: home  Patient Location: home  Mental Status Exam:   Appearance:   UTA-Unable to assess  Behavior:  UTA  Motor:  UTA  Speech/Language:   Clear and Coherent  Affect:  UTA  Mood:  Full range, congruent  Thought process:  Coherent and Relevant, goal directed  Thought content:    WDL, no SI/HI  Perceptual disturbances:    none  Orientation:  Full (Time, Place, and Person)  Attention:  Good  Concentration:  good  Memory:  WNL  Fund of knowledge:   Good  Insight:    Good  Judgment:   Good  Impulse Control:  developing   Reported Symptoms: Daily anxiety, obsessive thoughts, compulsive behavior, panic attacks  Risk Assessment: Danger to Self:  No Self-injurious Behavior: No Danger to Others: No Duty to Warn:no Physical Aggression / Violence:No  Access to Firearms a  concern: No  Gang Involvement:No   Patient / guardian was educated about steps to take if suicide or homicide risk level increases between visits. While future psychiatric events cannot be accurately predicted, the patient does not currently require acute inpatient psychiatric care and does not currently meet [facility] involuntary commitment criteria.  Subjective: Patient engaged in teletherapy session. She shared recent experiences, shared how she had a date recently that went well. Plans to continue to see this person.  Shared pressure from family regarding having to be married in the next few months.  She shared family dynamics and communication.  She shared how she has not desired to be married, but family pressure is strong from the paternal side due to their history of religious beliefs and traditions.  Assisted patient in identifying and processing her own personal needs and wants.  Provided support as she processed some feelings related to the impact this has on her life day-to-day.Continued to work with patient from a cognitive behavioral framework, strengths-based approaches.  Encouraged her to contact this office between these visits if needed.  Interventions: CBT, Solution Focused and Strength-based  Diagnosis:    ICD-10-CM   1. Generalized anxiety disorder F41.1   2. Attention deficit hyperactivity disorder (ADHD), predominantly inattentive type F90.0    Plan:   1.  Patient to continue to engage in individual counseling 2-4 times a month or as needed. 2.  Patient to identify and apply CBT, coping skills learned in session to decrease anxiety symptoms. 3.  Patient to contact this office, go to the local ED or call 911  if a crisis or emergency develops between visits.  Waldron Session, [facility]

## 2018-06-13 ENCOUNTER — Other Ambulatory Visit: Payer: Self-pay | Admitting: Pediatrics

## 2018-06-13 DIAGNOSIS — N6310 Unspecified lump in the right breast, unspecified quadrant: Secondary | ICD-10-CM

## 2018-06-21 ENCOUNTER — Other Ambulatory Visit: Payer: Self-pay

## 2018-06-21 ENCOUNTER — Ambulatory Visit (INDEPENDENT_AMBULATORY_CARE_PROVIDER_SITE_OTHER): Payer: 59 | Admitting: Mental Health

## 2018-06-21 DIAGNOSIS — F411 Generalized anxiety disorder: Secondary | ICD-10-CM

## 2018-06-21 NOTE — Progress Notes (Signed)
     [facility] Counselor/Therapist Progress Note   Patient ID: Michaela Pena, MRN: [id number]  Date: 06/21/18  Timespent: 55 minutes  Treatment Type: Individual   Mental Status Exam:   Appearance:   casual  Behavior:  WNL  Motor:  WNL  Speech/Language:   Clear and Coherent  Affect:  Full range  Mood:  anxious, congruent  Thought process:  Coherent and Relevant, goal directed  Thought content:    WDL, no SI/HI  Perceptual disturbances:    none  Orientation:  Full (Time, Place, and Person)  Attention:  Good  Concentration:  good  Memory:  WNL  Fund of knowledge:   Good  Insight:    Good  Judgment:   Good  Impulse Control:  developing   Reported Symptoms: Daily anxiety, obsessive thoughts, compulsive behavior, panic attacks  Risk Assessment: Danger to Self:  No Self-injurious Behavior: No Danger to Others: No Duty to Warn:no Physical Aggression / Violence:No  Access to Firearms a concern: No  Gang Involvement:No   Patient / guardian was educated about steps to take if suicide or homicide risk level increases between visits. While future psychiatric events cannot be accurately predicted, the patient does not currently require acute inpatient psychiatric care and does not currently meet [facility] involuntary commitment criteria.  Subjective: Patient engaged in teletherapy session.she shared progress.  Also shared some specifics around her continued family issues relating to her getting pressured to be married in the next few months.  Most of the session was centered around providing support and understanding as she processed feelings related.  Discussed ways to communicate with family in the event she decides to not go through with the process.  Explored with patient her feelings about the situation.  She went diving recently, stated she stopped at her usual stops.  Reviewed cognitive concepts discussed in previous sessions to lower anxiety producing thoughts.   Continued to work with patient from a cognitive behavioral framework, strengths-based approaches.  Interventions: CBT, Solution Focused and Strength-based  Diagnosis:    ICD-10-CM   1. Generalized anxiety disorder  F41.1    Plan:   1.  Patient to continue to engage in individual counseling 2-4 times a month or as needed. 2.  Patient to identify and apply CBT, coping skills learned in session to decrease anxiety symptoms. 3.  Patient to contact this office, go to the local ED or call 911 if a crisis or emergency develops between visits.  Anson Oregon, [facility]

## 2018-06-22 ENCOUNTER — Ambulatory Visit
Admission: RE | Admit: 2018-06-22 | Discharge: 2018-06-22 | Disposition: A | Discharge status: Home / Self Care | Payer: 59 | Source: Ambulatory Visit | Attending: Pediatrics | Admitting: Pediatrics

## 2018-06-22 ENCOUNTER — Other Ambulatory Visit: Payer: Self-pay | Admitting: Pediatrics

## 2018-06-22 ENCOUNTER — Other Ambulatory Visit: Payer: Self-pay

## 2018-06-22 DIAGNOSIS — N6310 Unspecified lump in the right breast, unspecified quadrant: Secondary | ICD-10-CM

## 2018-07-04 ENCOUNTER — Other Ambulatory Visit: Payer: Self-pay

## 2018-07-04 ENCOUNTER — Ambulatory Visit: Payer: 59 | Admitting: Mental Health

## 2018-07-04 DIAGNOSIS — F411 Generalized anxiety disorder: Secondary | ICD-10-CM

## 2018-07-04 NOTE — Progress Notes (Signed)
     [facility] Counselor/Therapist Progress Note   Patient ID: Michaela Pena, MRN: [id number]  Date: 07/04/18  Timespent: 54 minutes  Treatment Type: Individual   Mental Status Exam:   Appearance:   casual  Behavior:  wnl  Motor:  wnl  Speech/Language:   Clear and Coherent  Affect:  constricted  Mood:  anxious, congruent  Thought process:  Coherent and Relevant, goal directed  Thought content:    WDL, no SI/HI  Perceptual disturbances:    none  Orientation:  Full (Time, Place, and Person)  Attention:  Good  Concentration:  good  Memory:  WNL  Fund of knowledge:   Good  Insight:    Good  Judgment:   Good  Impulse Control:  developing   Reported Symptoms: Daily anxiety, obsessive thoughts, compulsive behavior, panic attacks  Risk Assessment: Danger to Self:  No Self-injurious Behavior: No Danger to Others: No Duty to Warn:no Physical Aggression / Violence:No  Access to Firearms a concern: No  Gang Involvement:No   Patient / guardian was educated about steps to take if suicide or homicide risk level increases between visits. While future psychiatric events cannot be accurately predicted, the patient does not currently require acute inpatient psychiatric care and does not currently meet [facility] involuntary commitment criteria.  Subjective: Patient engaged in teletherapy session. She shared recent experiences, shared how she continues to go on set up dates related to her faith, these are set up by her extended family that live out of state with a focus on marital arrangement.  Patient continues to share how she does not want to engage in the process or get married at this time but the family pressure is significant.  Explored with patient potential outcomes and subsequent feelings. Reviewed cognitive concepts discussed in previous sessions to lower anxiety producing thoughts.  Continued to work with patient from a cognitive behavioral framework, strengths-based  approaches.  Interventions: CBT, Solution Focused and Strength-based  Diagnosis:    ICD-10-CM   1. Generalized anxiety disorder  F41.1    Plan:   1.  Patient to continue to engage in individual counseling 2-4 times a month or as needed. 2.  Patient to identify and apply CBT, coping skills learned in session to decrease anxiety symptoms. 3.  Patient to contact this office, go to the local ED or call 911 if a crisis or emergency develops between visits.  Anson Oregon, [facility]

## 2018-07-05 ENCOUNTER — Ambulatory Visit: Payer: 59 | Admitting: Mental Health

## 2018-07-19 ENCOUNTER — Other Ambulatory Visit: Payer: Self-pay

## 2018-07-19 ENCOUNTER — Ambulatory Visit (INDEPENDENT_AMBULATORY_CARE_PROVIDER_SITE_OTHER): Payer: 59 | Admitting: Mental Health

## 2018-07-19 DIAGNOSIS — F411 Generalized anxiety disorder: Secondary | ICD-10-CM

## 2018-07-19 NOTE — Progress Notes (Signed)
     [facility] Counselor/Therapist Progress Note   Patient ID: Michaela Pena, MRN: [id number]  Date: 07/19/18  Timespent: 55 minutes  Treatment Type: Individual   Mental Status Exam:   Appearance:   casual  Behavior:  wnl  Motor:  wnl  Speech/Language:   Clear and Coherent  Affect:  constricted  Mood:  anxious, congruent  Thought process:  Coherent and Relevant, goal directed  Thought content:    WDL, no SI/HI  Perceptual disturbances:    none  Orientation:  Full (Time, Place, and Person)  Attention:  Good  Concentration:  good  Memory:  WNL  Fund of knowledge:   Good  Insight:    Good  Judgment:   Good  Impulse Control:  developing   Reported Symptoms: Daily anxiety, obsessive thoughts, compulsive behavior, panic attacks  Risk Assessment: Danger to Self:  No Self-injurious Behavior: No Danger to Others: No Duty to Warn:no Physical Aggression / Violence:No  Access to Firearms a concern: No  Gang Involvement:No   Patient / guardian was educated about steps to take if suicide or homicide risk level increases between visits. While future psychiatric events cannot be accurately predicted, the patient does not currently require acute inpatient psychiatric care and does not currently meet [facility] involuntary commitment criteria.   Subjective: Patient engaged in session in no distress. She continues to go on set up dates related to her faith, would rather not engage in these experiences, but continues to feel pressure from family.  We explored potentially what would occur if she discontinued the process.  She shared how some of her extended family, her paternal grandparents specifically would no longer speak to her she feels.  Patient identified not wanting to be in a relationship nor wanting to be married.  She continues to be at odds with the expectations of family.  She plans to talk to her mother next month about her concerns to receive some support.  She  continues to cope with daily anxiety, specifically when driving having to stop multiple times.  We discussed coping, specifically taking alternative routes to avoid her "identified spots" where she automatically stops to check her gas cap.  She stated that she has been taking her medication for the last month but denies having any positive effects, denies any negative side effects.  Encouraged her to discuss at her next medication management visit. Reviewed cognitive concepts discussed in previous sessions to lower anxiety producing thoughts.  Continued to work with patient from a cognitive behavioral framework, strengths-based approaches.   Interventions: CBT, Solution Focused and Strength-based  Diagnosis:  No diagnosis found. Plan:   1.  Patient to continue to engage in individual counseling 2-4 times a month or as needed. 2.  Patient to identify and apply CBT, coping skills learned in session to decrease anxiety symptoms. 3.  Patient to contact this office, go to the local ED or call 911 if a crisis or emergency develops between visits.  Anson Oregon, [facility]

## 2018-08-02 ENCOUNTER — Other Ambulatory Visit: Payer: Self-pay

## 2018-08-02 ENCOUNTER — Ambulatory Visit (INDEPENDENT_AMBULATORY_CARE_PROVIDER_SITE_OTHER): Payer: 59 | Admitting: Mental Health

## 2018-08-02 DIAGNOSIS — F411 Generalized anxiety disorder: Secondary | ICD-10-CM

## 2018-08-02 NOTE — Progress Notes (Signed)
     [facility] Counselor/Therapist Progress Note   Patient ID: Michaela Pena, MRN: [id number]  Date: 08/02/18  Timespent: 55 minutes  Treatment Type: Individual   Mental Status Exam:   Appearance:   casual  Behavior:  wnl  Motor:  wnl  Speech/Language:   Clear and Coherent  Affect:  constricted  Mood:  anxious, congruent  Thought process:  Coherent and Relevant, goal directed  Thought content:    WDL, no SI/HI  Perceptual disturbances:    none  Orientation:  Full (Time, Place, and Person)  Attention:  Good  Concentration:  good  Memory:  WNL  Fund of knowledge:   Good  Insight:    Good  Judgment:   Good  Impulse Control:  developing   Reported Symptoms: Daily anxiety, obsessive thoughts, compulsive behavior, panic attacks  Risk Assessment: Danger to Self:  No Self-injurious Behavior: No Danger to Others: No Duty to Warn:no Physical Aggression / Violence:No  Access to Firearms a concern: No  Gang Involvement:No   Patient / guardian was educated about steps to take if suicide or homicide risk level increases between visits. While future psychiatric events cannot be accurately predicted, the patient does not currently require acute inpatient psychiatric care and does not currently meet [facility] involuntary commitment criteria.   Subjective: Patient engaged in session in no distress.  She shared progress, how she continues to go on some video dates related to her religious beliefs which are set up by family members.  She shared how she plans to talk with her mother prior to her coming birthday, which is the time that she is to decide on who she will marry by her family's religious beliefs.  Time was spent to allow patient to process feelings related.  Patient continues to express concern due to how it may impact her sisters who also are to follow in this tradition.  Patient shared how she feels they will be negatively affected by her decision.  Discussed some  ways to communicate feelings and concerns with her mother.  Continued to work with patient from a cognitive behavioral framework, strengths-based approaches.   Interventions: CBT, Solution Focused and Strength-based  Diagnosis:  No diagnosis found. Plan:   1.  Patient to continue to engage in individual counseling 2-4 times a month or as needed. 2.  Patient to identify and apply CBT, coping skills learned in session to decrease anxiety symptoms. 3.  Patient to contact this office, go to the local ED or call 911 if a crisis or emergency develops between visits.  Anson [place], [facility]

## 2018-08-16 ENCOUNTER — Ambulatory Visit: Payer: 59 | Admitting: Mental Health

## 2018-08-17 ENCOUNTER — Ambulatory Visit (INDEPENDENT_AMBULATORY_CARE_PROVIDER_SITE_OTHER): Payer: 59 | Admitting: Mental Health

## 2018-08-17 ENCOUNTER — Other Ambulatory Visit: Payer: Self-pay

## 2018-08-17 DIAGNOSIS — F411 Generalized anxiety disorder: Secondary | ICD-10-CM | POA: Diagnosis not present

## 2018-08-17 DIAGNOSIS — F900 Attention-deficit hyperactivity disorder, predominantly inattentive type: Secondary | ICD-10-CM | POA: Diagnosis not present

## 2018-08-17 NOTE — Progress Notes (Signed)
     [facility] Counselor/Therapist Progress Note   Patient ID: Michaela Pena, MRN: [id number]  Date: 08/17/18  Timespent: 56 minutes  Treatment Type: Individual   Mental Status Exam:   Appearance:   casual  Behavior:  wnl  Motor:  wnl  Speech/Language:   Clear and Coherent  Affect:  constricted  Mood:  anxious, congruent  Thought process:  Coherent and Relevant, goal directed  Thought content:    WDL, no SI/HI  Perceptual disturbances:    none  Orientation:  Full (Time, Place, and Person)  Attention:  Good  Concentration:  good  Memory:  WNL  Fund of knowledge:   Good  Insight:    Good  Judgment:   Good  Impulse Control:  developing   Reported Symptoms: Daily anxiety, obsessive thoughts, compulsive behavior, panic attacks  Risk Assessment: Danger to Self:  No Self-injurious Behavior: No Danger to Others: No Duty to Warn:no Physical Aggression / Violence:No  Access to Firearms a concern: No  Gang Involvement:No   Patient / guardian was educated about steps to take if suicide or homicide risk level increases between visits. While future psychiatric events cannot be accurately predicted, the patient does not currently require acute inpatient psychiatric care and does not currently meet [facility] involuntary commitment criteria.   Subjective: Patient engaged in session in no distress.  Patient was able to follow-up with plans last session made where she was to talk to her mother about her wanting to discontinue the court ship process instigated via her family's religious beliefs.  She shared how her mother was upset when she gave her the news.  She shared details of the discussion and how she and her mother have not spoke about since.  She stated she decided to talk with her prior to her birthday.  Patient feels she made the best decision for herself however, knows this will impact in the family and her sisters primarily.  She shared how it may affect their  standing in the process for themselves later when they get older.  Provide support, understanding.  Some specific ways to communicate going forward during this time were explored with patient.  Assisted patient in identifying and processing feelings related which can be difficult for her.  Assisted her in challenging some negative self talk that has occurred recently due to the more strained family relationships.   continued to work with patient from a cognitive behavioral framework, strengths-based approaches.   Interventions: CBT, Solution Focused and Strength-based approaches  Diagnosis:    ICD-10-CM   1. Generalized anxiety disorder  F41.1   2. Attention deficit hyperactivity disorder (ADHD), predominantly inattentive type  F90.0    Plan:   1.  Patient to continue to engage in individual counseling 2-4 times a month or as needed. 2.  Patient to identify and apply CBT, coping skills learned in session to decrease anxiety symptoms. 3.  Patient to contact this office, go to the local ED or call 911 if a crisis or emergency develops between visits.  Anson [place], [facility]

## 2018-08-30 ENCOUNTER — Other Ambulatory Visit: Payer: Self-pay

## 2018-08-30 ENCOUNTER — Ambulatory Visit (INDEPENDENT_AMBULATORY_CARE_PROVIDER_SITE_OTHER): Payer: 59 | Admitting: Mental Health

## 2018-08-30 DIAGNOSIS — F900 Attention-deficit hyperactivity disorder, predominantly inattentive type: Secondary | ICD-10-CM | POA: Diagnosis not present

## 2018-08-30 DIAGNOSIS — F411 Generalized anxiety disorder: Secondary | ICD-10-CM

## 2018-08-30 NOTE — Progress Notes (Signed)
     [facility] Counselor/Therapist Progress Note   Patient ID: Michaela Pena, MRN: [id number]  Date: 08/30/18  Timespent: 56 minutes   Treatment Type: Individual   Mental Status Exam:   Appearance:   casual  Behavior:  wnl  Motor:  wnl  Speech/Language:   Clear and Coherent  Affect:  constricted  Mood:  anxious, congruent  Thought process:  Coherent and Relevant, goal directed  Thought content:    WDL, no SI/HI  Perceptual disturbances:    none  Orientation:  Full (Time, Place, and Person)  Attention:  Good  Concentration:  good  Memory:  WNL  Fund of knowledge:   Good  Insight:    Good  Judgment:   Good  Impulse Control:  developing   Reported Symptoms: Daily anxiety, obsessive thoughts, compulsive behavior, panic attacks  Risk Assessment: Danger to Self:  No Self-injurious Behavior: No Danger to Others: No Duty to Warn:no Physical Aggression / Violence:No  Access to Firearms a concern: No  Gang Involvement:No   Patient / guardian was educated about steps to take if suicide or homicide risk level increases between visits. While future psychiatric events cannot be accurately predicted, the patient does not currently require acute inpatient psychiatric care and does not currently meet [facility] involuntary commitment criteria.   Subjective: Patient engaged in session in no distress.  She shared progress, namely her relationship with her mother and sisters.  She stated that she has been spending more time in her room due to the relational strain she is having with them.  She stated that her sister texted her recently and was upset about her decision not to go forward with the courtship process.  Patient shared it has been difficult emotionally at times to cope but overall feels comfortable with her decision.  Provide support as she processed feelings and emotions related.  Ways to communicate in these relationships was explored with patient.  Patient plans to  follow through.  Patient continues to cope with some increased levels of anxiety continue some repetitive behaviors related to checking.  We reviewed cognitive strategies calming self talk to utilize.  Continued to work with patient from a cognitive behavioral framework, strengths-based approaches.   Interventions: CBT, Solution Focused and Strength-based  Diagnosis:    ICD-10-CM   1. Attention deficit hyperactivity disorder (ADHD), predominantly inattentive type  F90.0   2. Generalized anxiety disorder  F41.1    Plan:   1.  Patient to continue to engage in individual counseling 2-4 times a month or as needed. 2.  Patient to identify and apply CBT, coping skills learned in session to decrease anxiety symptoms. 3.  Patient to contact this office, go to the local ED or call 911 if a crisis or emergency develops between visits.  Anson [place], [facility]

## 2018-09-13 ENCOUNTER — Ambulatory Visit (INDEPENDENT_AMBULATORY_CARE_PROVIDER_SITE_OTHER): Payer: 59 | Admitting: Mental Health

## 2018-09-13 ENCOUNTER — Other Ambulatory Visit: Payer: Self-pay

## 2018-09-13 DIAGNOSIS — F900 Attention-deficit hyperactivity disorder, predominantly inattentive type: Secondary | ICD-10-CM | POA: Diagnosis not present

## 2018-09-13 DIAGNOSIS — F411 Generalized anxiety disorder: Secondary | ICD-10-CM | POA: Diagnosis not present

## 2018-09-13 NOTE — Progress Notes (Signed)
     [facility] Counselor/Therapist Progress Note   Patient ID: Michaela Pena, MRN: [id number]  Date: 09/13/18  Timespent: 56 minutes   Treatment Type: Individual   Mental Status Exam:   Appearance:   casual  Behavior:  wnl  Motor:  wnl  Speech/Language:   Clear and Coherent  Affect:  constricted  Mood:  anxious, congruent  Thought process:  Coherent and Relevant, goal directed  Thought content:    WDL, no SI/HI  Perceptual disturbances:    none  Orientation:  Full (Time, Place, and Person)  Attention:  Good  Concentration:  good  Memory:  WNL  Fund of knowledge:   Good  Insight:    Good  Judgment:   Good  Impulse Control:  developing   Reported Symptoms: Daily anxiety, obsessive thoughts, compulsive behavior, panic attacks  Risk Assessment: Danger to Self:  No Self-injurious Behavior: No Danger to Others: No Duty to Warn:no Physical Aggression / Violence:No  Access to Firearms a concern: No  Gang Involvement:No   Patient / guardian was educated about steps to take if suicide or homicide risk level increases between visits. While future psychiatric events cannot be accurately predicted, the patient does not currently require acute inpatient psychiatric care and does not currently meet [facility] involuntary commitment criteria.   Subjective: Patient engaged in session in no distress.  She shared progress, Patient continues to cope with some increased levels of anxiety continue some repetitive behaviors related to checking.  She shared more history relating to early is checking behaviors related to locks in the home when she was approximately age 23.  She stated that she wakes that some point in the night to check the locks on doors and has done this since that time to present day.  She shared how she began checking her gas While driving soon after getting her license at approximately age 39.  Reviewed systematic steps to take to decrease some of these behaviors  for follow-through between sessions.  Encouraged to her to document thoughts she has that interfere with her ability to follow-through.  She shared the current family dynamic with her mother and sisters.  She said that her 59 year old sister is not talking to her presently and her 19 year old sister is away at college but has also not spoken.  She stated that she and her mother are getting along well overall, she feels her mother will continue to need time to continue to get comfortable with her decision.  Patient denied high anxiety or sadness related to her decision not to get married at this point.  Provide support, continuing to work with patient from a cognitive behavioral framework.  Interventions: CBT, Solution Focused and Strength-based  Diagnosis:    ICD-10-CM   1. Attention deficit hyperactivity disorder (ADHD), predominantly inattentive type  F90.0   2. Generalized anxiety disorder  F41.1    Plan:   1.  Patient to continue to engage in individual counseling 2-4 times a month or as needed. 2.  Patient to identify and apply CBT, coping skills learned in session to decrease anxiety symptoms. 3.  Patient to contact this office, go to the local ED or call 911 if a crisis or emergency develops between visits.  Anson [place], [facility]

## 2018-09-27 ENCOUNTER — Other Ambulatory Visit: Payer: Self-pay

## 2018-09-27 ENCOUNTER — Ambulatory Visit (INDEPENDENT_AMBULATORY_CARE_PROVIDER_SITE_OTHER): Payer: 59 | Admitting: Mental Health

## 2018-09-27 DIAGNOSIS — F900 Attention-deficit hyperactivity disorder, predominantly inattentive type: Secondary | ICD-10-CM | POA: Diagnosis not present

## 2018-09-27 DIAGNOSIS — F411 Generalized anxiety disorder: Secondary | ICD-10-CM

## 2018-09-27 NOTE — Progress Notes (Signed)
     [facility] Counselor/Therapist Progress Note   Patient ID: Rhyen Mazariego, MRN: [id number]  Date: 09/27/18  Timespent: 54 minutes   Treatment Type: Individual   Mental Status Exam:   Appearance:   casual  Behavior:  wnl  Motor:  wnl  Speech/Language:   Clear and Coherent  Affect:  constricted  Mood:  anxious, congruent  Thought process:  Coherent and Relevant, goal directed  Thought content:    WDL, no SI/HI  Perceptual disturbances:    none  Orientation:  Full (Time, Place, and Person)  Attention:  Good  Concentration:  good  Memory:  WNL  Fund of knowledge:   Good  Insight:    Good  Judgment:   Good  Impulse Control:  developing   Reported Symptoms: Daily anxiety, obsessive thoughts, compulsive behavior, panic attacks  Risk Assessment: Danger to Self:  No Self-injurious Behavior: No Danger to Others: No Duty to Warn:no Physical Aggression / Violence:No  Access to Firearms a concern: No  Gang Involvement:No   Patient / guardian was educated about steps to take if suicide or homicide risk level increases between visits. While future psychiatric events cannot be accurately predicted, the patient does not currently require acute inpatient psychiatric care and does not currently meet [facility] involuntary commitment criteria.   Subjective: Patient engaged in session in no distress.  Discussed the ongoing relational progress she is making with her mother and sisters due to her discontinuing the marital courtship process facilitated by her grandmother through her [place].  Patient states she has not seen her younger sister for the past 2 to 3 weeks as she is living with a friend while they are in the process of relocating to their new home.  She denied having any contact with her other sister as she is in college currently.  Shared how the relationship with her mother is going well overall. explored progress with related to repetitive behaviors related to  checking.  She shared how she was successful with 1 drive home between sessions which followed our last therapy session.  She stated she drove approximately 34minutes without stopping and checking her gas cap.  Reviewed steps with patient to take to increase consistency targeting this behavior only to make incremental progress.  Reviewed graded exposure process and benefits potentially.  Was in agreement to continue the difficult process of increasing 2-3 more instances where she will not pull over to repeat checking behavior.  Provide support, continuing to work with patient from a cognitive behavioral framework.  Interventions: CBT, Solution Focused and Strength-based  Diagnosis:    ICD-10-CM   1. Generalized anxiety disorder  F41.1   2. Attention deficit hyperactivity disorder (ADHD), predominantly inattentive type  F90.0    Plan:   1.  Patient to continue to engage in individual counseling 2-4 times a month or as needed. 2.  Patient to identify and apply CBT, coping skills learned in session to decrease anxiety symptoms. 3.  Patient to contact this office, go to the local ED or call 911 if a crisis or emergency develops between visits.  Anson [place], [facility]

## 2018-09-28 ENCOUNTER — Ambulatory Visit (INDEPENDENT_AMBULATORY_CARE_PROVIDER_SITE_OTHER): Payer: 59 | Admitting: Psychiatry

## 2018-09-28 ENCOUNTER — Encounter: Payer: Self-pay | Admitting: Psychiatry

## 2018-09-28 ENCOUNTER — Other Ambulatory Visit: Payer: Self-pay

## 2018-09-28 VITALS — BP 105/77 | HR 98

## 2018-09-28 DIAGNOSIS — F3342 Major depressive disorder, recurrent, in full remission: Secondary | ICD-10-CM

## 2018-09-28 DIAGNOSIS — F411 Generalized anxiety disorder: Secondary | ICD-10-CM

## 2018-09-28 DIAGNOSIS — F900 Attention-deficit hyperactivity disorder, predominantly inattentive type: Secondary | ICD-10-CM | POA: Diagnosis not present

## 2018-09-28 MED ORDER — SERTRALINE HCL 100 MG PO TABS: 200.0000 mg | ORAL_TABLET | Freq: Every day | ORAL | 1 refills | Status: DC

## 2018-09-28 MED ORDER — BUPROPION HCL ER (XL) 150 MG PO TB24: 150.0000 mg | ORAL_TABLET | ORAL | 1 refills | Status: DC

## 2018-09-28 MED ORDER — AMPHETAMINE SULFATE 10 MG PO TABS: 10.0000 mg | ORAL_TABLET | Freq: Two times a day (BID) | ORAL | 0 refills | Status: DC

## 2018-09-28 NOTE — Progress Notes (Signed)
Michaela Pena Michaela Pena [id number] 07/17/1995 23 y.o.  Subjective:   Patient ID:  Michaela Pena is a 23 y.o. (DOB 11/17/1995) female.  Chief Complaint:  Chief Complaint  Patient presents with  . ADD  . Follow-up    Anxiety, Depression    HPI Michaela Pena Michaela Pena presents to the office today for follow-up of anxiety and ADD. She reports anxiety has been manageable. Some continued obsessive thoughts and compulsions. Denies depressed mood. She reports that ADD s/s have been well controlled. Taking classes online and reports that she prefers this and has been less distracted. Reports that she has not been taking Adderall due to side effects. Sleeping well. Appetite has been good. Energy and motivation have been fine. Denies SI.   Completing senior year. Taking all classes remotely. Working part-time.   Past Psychiatric Medication Trials: Wellbutrin XL Sertraline Adderall XR- Effective, however unable to tolerate due to n/v Adderall immediate release- side effects  Review of Systems:  Review of Systems  Cardiovascular: Negative for palpitations.  Gastrointestinal: Negative.   Musculoskeletal: Negative for gait problem.  Neurological: Negative for tremors and headaches.  Psychiatric/Behavioral:       Please refer to HPI    Medications: I have reviewed the patient's current medications.  Current Outpatient Medications  Medication Sig Dispense Refill  . Amphetamine Sulfate (EVEKEO) 10 MG TABS Take 10 mg by mouth 2 (two) times daily. May take 1/2-1 tab po BID 60 tablet 0  . buPROPion (WELLBUTRIN XL) 150 MG 24 hr tablet Take 1 tablet (150 mg total) by mouth every morning. 90 tablet 1  . cholecalciferol (VITAMIN D3) 25 MCG (1000 UT) tablet Take 1,000 Units by mouth daily.    . sertraline (ZOLOFT) 100 MG tablet Take 2 tablets (200 mg total) by mouth daily. 2 po qd 180 tablet 1   No current facility-administered medications for this visit.     Medication Side Effects:  None  Allergies: No Known Allergies  History reviewed. No pertinent past medical history.  Family History  Problem Relation Age of Onset  . Anxiety disorder Mother   . Depression Mother   . Anxiety disorder Maternal Grandmother   . Depression Maternal Grandmother     Social History   Socioeconomic History  . Marital status: Unknown    Spouse name: Not on file  . Number of children: Not on file  . Years of education: Not on file  . Highest education level: Not on file  Occupational History  . Not on file  Social Needs  . Financial resource strain: Not on file  . Food insecurity    Worry: Not on file    Inability: Not on file  . Transportation needs    Medical: Not on file    Non-medical: Not on file  Tobacco Use  . Smoking status: Never Smoker  . Smokeless tobacco: Never Used  Substance and Sexual Activity  . Alcohol use: Not on file  . Drug use: Not on file  . Sexual activity: Not on file  Lifestyle  . Physical activity    Days per week: Not on file    Minutes per session: Not on file  . Stress: Not on file  Relationships  . Social Musicianconnections    Talks on phone: Not on file    Gets together: Not on file    Attends religious service: Not on file    Active member of club or organization: Not on file    Attends meetings of clubs  or organizations: Not on file    Relationship status: Not on file  . Intimate partner violence    Fear of current or ex partner: Not on file    Emotionally abused: Not on file    Physically abused: Not on file    Forced sexual activity: Not on file  Other Topics Concern  . Not on file  Social History Narrative  . Not on file    Past Medical History, Surgical history, Social history, and Family history were reviewed and updated as appropriate.   Please see review of systems for further details on the patient's review from today.   Objective:   Physical Exam:  BP 105/77   Pulse 98   Physical Exam Constitutional:       General: She is not in acute distress.    Appearance: She is well-developed.  Musculoskeletal:        General: No deformity.  Neurological:     Mental Status: She is alert and oriented to person, place, and time.     Coordination: Coordination normal.  Psychiatric:        Attention and Perception: Attention and perception normal. She does not perceive auditory or visual hallucinations.        Mood and Affect: Mood is not anxious or depressed. Affect is blunt. Affect is not labile, angry or inappropriate.        Speech: Speech normal.        Behavior: Behavior normal.        Thought Content: Thought content normal. Thought content is not paranoid or delusional. Thought content does not include homicidal or suicidal ideation. Thought content does not include homicidal or suicidal plan.        Cognition and Memory: Cognition and memory normal.        Judgment: Judgment normal.     Comments: Insight intact. No delusions.      Lab Review:  No results found for: NA, K, CL, CO2, GLUCOSE, BUN, CREATININE, CALCIUM, PROT, ALBUMIN, AST, ALT, ALKPHOS, BILITOT, GFRNONAA, GFRAA  No results found for: WBC, RBC, HGB, HCT, PLT, MCV, MCH, MCHC, RDW, LYMPHSABS, MONOABS, EOSABS, BASOSABS  No results found for: POCLITH, LITHIUM   No results found for: PHENYTOIN, PHENOBARB, VALPROATE, CBMZ   .res Assessment: Plan:   Discussed potential benefits, risks, and side effects of of Evekeo for ADD signs and symptoms.  Will start of Evekeo 10 mg 1/2-1 tab p.o. twice daily for ADD.  Recommended starting with 5 mg dose and then increasing to 10 mg if 5 mg is not fully effective and is well-tolerated. Discussed considering Adzenys if [name] is not covered or effective. Will continue Wellbutrin XL 150 mg p.o. daily for depression and concentration. We will continue sertraline 200 mg daily for anxiety and depression. Recommend continuing psychotherapy with Lanetta Inch, LPC. Patient to follow-up with this provider  in 4 weeks or sooner if clinically indicated. Patient advised to contact office with any questions, adverse effects, or acute worsening in signs and symptoms.  Michaela Pena was seen today for add and follow-up.  Diagnoses and all orders for this visit:  Attention deficit hyperactivity disorder (ADHD), predominantly inattentive type -     Amphetamine Sulfate (EVEKEO) 10 MG TABS; Take 10 mg by mouth 2 (two) times daily. May take 1/2-1 tab po BID  Recurrent major depressive disorder, in full remission ([place]) -     buPROPion (WELLBUTRIN XL) 150 MG 24 hr tablet; Take 1 tablet (150 mg total) by mouth  every morning. -     sertraline (ZOLOFT) 100 MG tablet; Take 2 tablets (200 mg total) by mouth daily. 2 po qd  Generalized anxiety disorder -     sertraline (ZOLOFT) 100 MG tablet; Take 2 tablets (200 mg total) by mouth daily. 2 po qd     Please see After Visit Summary for patient specific instructions.  Future Appointments  Date Time Provider Department Center  10/11/2018  1:00 PM Waldron Session, [facility] None  10/25/2018  9:30 AM Corie Chiquito, PMHNP [facility] None  10/25/2018  1:00 PM Waldron Session, [facility] None    No orders of the defined types were placed in this encounter.   -------------------------------

## 2018-10-11 ENCOUNTER — Ambulatory Visit (INDEPENDENT_AMBULATORY_CARE_PROVIDER_SITE_OTHER): Payer: 59 | Admitting: Mental Health

## 2018-10-11 ENCOUNTER — Other Ambulatory Visit: Payer: Self-pay

## 2018-10-11 DIAGNOSIS — F900 Attention-deficit hyperactivity disorder, predominantly inattentive type: Secondary | ICD-10-CM

## 2018-10-11 DIAGNOSIS — F411 Generalized anxiety disorder: Secondary | ICD-10-CM

## 2018-10-11 NOTE — Progress Notes (Signed)
     [facility] Counselor/Therapist Progress Note   Patient ID: Michaela Pena, MRN: [id number]  Date: 09/27/18  Timespent: 54 minutes   Treatment Type: Individual   Mental Status Exam:   Appearance:   casual  Behavior:  wnl  Motor:  wnl  Speech/Language:   Clear and Coherent  Affect:  constricted  Mood:  anxious, congruent  Thought process:  Coherent and Relevant, goal directed  Thought content:    WDL, no SI/HI  Perceptual disturbances:    none  Orientation:  Full (Time, Place, and Person)  Attention:  Good  Concentration:  good  Memory:  WNL  Fund of knowledge:   Good  Insight:    Good  Judgment:   Good  Impulse Control:  developing   Reported Symptoms: Daily anxiety, obsessive thoughts, compulsive behavior, panic attacks  Risk Assessment: Danger to Self:  No Self-injurious Behavior: No Danger to Others: No Duty to Warn:no Physical Aggression / Violence:No  Access to Firearms a concern: No  Gang Involvement:No   Patient / guardian was educated about steps to take if suicide or homicide risk level increases between visits. While future psychiatric events cannot be accurately predicted, the patient does not currently require acute inpatient psychiatric care and does not currently meet [facility] involuntary commitment criteria.   Subjective: Patient engaged in session in no distress.  She discussed progress, recent events she shared how she and her sisters traveled to [place] to see family due to [place] holidays.  She shared experiences.  She stated her grandmother was pleasant and surprisingly and overall it was a pleasant visit.  She stated that she saw her father but they did not speak but that she did meet his new wife and she was pleasant.  She stated that her sisters were pleasant also and did not bring up the recent issue of patient deciding not to get married at this point in her life.  We discussed her progress with safety checking behaviors related  to her gas cap on her car.  She stated that she had 2 days of successful progress where she did not check 1 of which being today.  We discussed alternative coping such as not stopping the car to get out and checking with her mirror visually only.  Patient plans to follow through between sessions.  We discussed taking steps in these areas to eventually extinguish her checking behavior when driving.  Provide support, continuing to work with patient from a cognitive behavioral framework.  Interventions: CBT, Solution Focused and Strength-based  Diagnosis:    ICD-10-CM   1. Attention deficit hyperactivity disorder (ADHD), predominantly inattentive type  F90.0   2. Generalized anxiety disorder  F41.1    Plan:   1.  Patient to continue to engage in individual counseling 2-4 times a month or as needed. 2.  Patient to identify and apply CBT, coping skills learned in session to decrease anxiety symptoms. 3.  Patient to contact this office, go to the local ED or call 911 if a crisis or emergency develops between visits.  Anson [place], [facility]

## 2018-10-16 ENCOUNTER — Telehealth: Payer: Self-pay

## 2018-10-16 NOTE — Telephone Encounter (Signed)
Prior authorization submitted and approved for Amphetamine sulfate 10 mg tablets effective 10/16/2018-10/17/2019 through [facility]

## 2018-10-25 ENCOUNTER — Other Ambulatory Visit: Payer: Self-pay

## 2018-10-25 ENCOUNTER — Encounter: Payer: Self-pay | Admitting: Psychiatry

## 2018-10-25 ENCOUNTER — Ambulatory Visit (INDEPENDENT_AMBULATORY_CARE_PROVIDER_SITE_OTHER): Payer: 59 | Admitting: Psychiatry

## 2018-10-25 ENCOUNTER — Ambulatory Visit (INDEPENDENT_AMBULATORY_CARE_PROVIDER_SITE_OTHER): Payer: 59 | Admitting: Mental Health

## 2018-10-25 VITALS — BP 100/65 | HR 99

## 2018-10-25 DIAGNOSIS — F900 Attention-deficit hyperactivity disorder, predominantly inattentive type: Secondary | ICD-10-CM | POA: Diagnosis not present

## 2018-10-25 DIAGNOSIS — F411 Generalized anxiety disorder: Secondary | ICD-10-CM

## 2018-10-25 DIAGNOSIS — F3342 Major depressive disorder, recurrent, in full remission: Secondary | ICD-10-CM

## 2018-10-25 MED ORDER — AMPHETAMINE SULFATE 10 MG PO TABS: 10.0000 mg | ORAL_TABLET | Freq: Every morning | ORAL | 0 refills | Status: DC

## 2018-10-25 NOTE — Progress Notes (Signed)
Michaela Pena [id number] 09/13/1995 23 y.o.  Subjective:   Patient ID:  Michaela Pena is a 23 y.o. (DOB 13-Jan-1995) female.  Chief Complaint:  Chief Complaint  Patient presents with  . ADD  . Follow-up    h/o anxiety and depression    HPI Michaela Pena presents to the office today for follow-up of ADD. She reports that [name] has worked well for ADD s/s. She started with 1/2 tab and then increased to one 10 mg tab and notices that this is effective throughout the day.   Denies any change in anxiety. Denies irritability. Denies depressed mood. Sleeping well. Appetite has been stable. Energy and motivation have been good. Concentration improved with Evekeo. Denies SI.   Past Psychiatric Medication Trials: Wellbutrin XL Sertraline Adderall XR- Effective, however unable to tolerate due to n/v Adderall immediate release- side effects  Review of Systems:  Review of Systems  Cardiovascular: Negative for palpitations.  Musculoskeletal: Negative for gait problem.  Neurological: Negative for tremors.  Psychiatric/Behavioral:       Please refer to HPI    Medications: I have reviewed the patient's current medications.  Current Outpatient Medications  Medication Sig Dispense Refill  . [START ON 11/14/2018] Amphetamine Sulfate (EVEKEO) 10 MG TABS Take 10 mg by mouth every morning. 30 tablet 0  . buPROPion (WELLBUTRIN XL) 150 MG 24 hr tablet Take 1 tablet (150 mg total) by mouth every morning. 90 tablet 1  . sertraline (ZOLOFT) 100 MG tablet Take 2 tablets (200 mg total) by mouth daily. 2 po qd 180 tablet 1  . [START ON 12/12/2018] Amphetamine Sulfate (EVEKEO) 10 MG TABS Take 10 mg by mouth every morning. 30 tablet 0  . [START ON 01/09/2019] Amphetamine Sulfate (EVEKEO) 10 MG TABS Take 10 mg by mouth every morning. 30 tablet 0  . cholecalciferol (VITAMIN D3) 25 MCG (1000 UT) tablet Take 1,000 Units by mouth daily.     No current facility-administered  medications for this visit.     Medication Side Effects: None  Allergies: No Known Allergies  History reviewed. No pertinent past medical history.  Family History  Problem Relation Age of Onset  . Anxiety disorder Mother   . Depression Mother   . Anxiety disorder Maternal Grandmother   . Depression Maternal Grandmother     Social History   Socioeconomic History  . Marital status: Unknown    Spouse name: Not on file  . Number of children: Not on file  . Years of education: Not on file  . Highest education level: Not on file  Occupational History  . Not on file  Social Needs  . Financial resource strain: Not on file  . Food insecurity    Worry: Not on file    Inability: Not on file  . Transportation needs    Medical: Not on file    Non-medical: Not on file  Tobacco Use  . Smoking status: Never Smoker  . Smokeless tobacco: Never Used  Substance and Sexual Activity  . Alcohol use: Not on file  . Drug use: Not on file  . Sexual activity: Not on file  Lifestyle  . Physical activity    Days per week: Not on file    Minutes per session: Not on file  . Stress: Not on file  Relationships  . Social Herbalist on phone: Not on file    Gets together: Not on file    Attends religious service: Not on file  Active member of club or organization: Not on file    Attends meetings of clubs or organizations: Not on file    Relationship status: Not on file  . Intimate partner violence    Fear of current or ex partner: Not on file    Emotionally abused: Not on file    Physically abused: Not on file    Forced sexual activity: Not on file  Other Topics Concern  . Not on file  Social History Narrative  . Not on file    Past Medical History, Surgical history, Social history, and Family history were reviewed and updated as appropriate.   Please see review of systems for further details on the patient's review from today.   Objective:   Physical Exam:  BP  100/65   Pulse 99   Physical Exam Constitutional:      General: She is not in acute distress.    Appearance: She is well-developed.  Musculoskeletal:        General: No deformity.  Neurological:     Mental Status: She is alert and oriented to person, place, and time.     Coordination: Coordination normal.  Psychiatric:        Attention and Perception: Attention and perception normal. She does not perceive auditory or visual hallucinations.        Mood and Affect: Mood normal. Mood is not anxious or depressed. Affect is not labile, blunt, angry or inappropriate.        Speech: Speech normal.        Behavior: Behavior normal.        Thought Content: Thought content normal. Thought content does not include homicidal or suicidal ideation. Thought content does not include homicidal or suicidal plan.        Cognition and Memory: Cognition and memory normal.        Judgment: Judgment normal.     Comments: Insight intact. No delusions.      Lab Review:  No results found for: NA, K, CL, CO2, GLUCOSE, BUN, CREATININE, CALCIUM, PROT, ALBUMIN, AST, ALT, ALKPHOS, BILITOT, GFRNONAA, GFRAA  No results found for: WBC, RBC, HGB, HCT, PLT, MCV, MCH, MCHC, RDW, LYMPHSABS, MONOABS, EOSABS, BASOSABS  No results found for: POCLITH, LITHIUM   No results found for: PHENYTOIN, PHENOBARB, VALPROATE, CBMZ   .res Assessment: Plan:   Patient reports that attention deficit signs and symptoms have significantly improved with of Evekeo which has been well-tolerated.  She reports that 10 mg daily has been effective and would like to continue this dose. Mood and anxiety s/s remain well controlled with Wellbutrin and sertraline.  Patient does not need refills at this time.  Will continue current dose. Recommend continuing psychotherapy with Elio Forgethris Andrews, LPC. Patient to follow-up with this provider in 3 months or sooner if clinically indicated. Patient advised to contact office with any questions, adverse  effects, or acute worsening in signs and symptoms.  Rennis Hardingllis was seen today for add and follow-up.  Diagnoses and all orders for this visit:  Attention deficit hyperactivity disorder (ADHD), predominantly inattentive type -     Amphetamine Sulfate (EVEKEO) 10 MG TABS; Take 10 mg by mouth every morning. -     Amphetamine Sulfate (EVEKEO) 10 MG TABS; Take 10 mg by mouth every morning. -     Amphetamine Sulfate (EVEKEO) 10 MG TABS; Take 10 mg by mouth every morning.  Generalized anxiety disorder  Recurrent major depressive disorder, in full remission (HCC)     Please  see After Visit Summary for patient specific instructions.  Future Appointments  Date Time Provider Department Center  10/25/2018  1:00 PM [place] Session, [facility] None  11/08/2018  1:00 PM [place] Session, [facility] None    No orders of the defined types were placed in this encounter.   -------------------------------

## 2018-10-25 NOTE — Progress Notes (Signed)
     [facility] Counselor/Therapist Progress Note   Patient ID: Michaela Pena, MRN: [id number]  Date: 10/25/18  Timespent: 56 minutes   Treatment Type: Individual   Mental Status Exam:   Appearance:   casual  Behavior:  wnl  Motor:  wnl  Speech/Language:   Clear and Coherent  Affect:  constricted  Mood:  anxious, congruent  Thought process:  Coherent and Relevant, goal directed  Thought content:    WDL, no SI/HI  Perceptual disturbances:    none  Orientation:  Full (Time, Place, and Person)  Attention:  Good  Concentration:  good  Memory:  WNL  Fund of knowledge:   Good  Insight:    Good  Judgment:   Good  Impulse Control:  developing   Reported Symptoms: Daily anxiety, obsessive thoughts, compulsive behavior, panic attacks  Risk Assessment: Danger to Self:  No Self-injurious Behavior: No Danger to Others: No Duty to Warn:no Physical Aggression / Violence:No  Access to Firearms a concern: No  Gang Involvement:No   Patient / guardian was educated about steps to take if suicide or homicide risk level increases between visits. While future psychiatric events cannot be accurately predicted, the patient does not currently require acute inpatient psychiatric care and does not currently meet [facility] involuntary commitment criteria.   Subjective: Patient engaged in session in no distress.  Discussed progress related to her anxiety, obsessive-compulsive tendencies.  She shared she had 1 day successful between sessions and not checking her gas cap while driving.  Reviewed with patient the significance of making increased attempts over time.  She continues to have door lock checking behavior where she will check the locks sequentially in the home 3 times in the evening.  Through guided discovery, assisted her in identifying thought distortions that maintain these behaviors which have occurred for many years.  She stated that she is also been checking her car door  locks at night where she will go out to the car checking all doors as opposed to using her key remote.  Gave patient homework assignment related to decreasing checking behaviors, specifically locking each lock, verifying and using STOPP technique.  Interventions: CBT, Solution Focused and Strength-based  Diagnosis:    ICD-10-CM   1. Generalized anxiety disorder  F41.1      Plan: Patient is to use CBT and coping skills to help manage her daily anxiety and obsessive compulsive behaviors.  Pt is to decrease checking behaviors - checking door locks, car locks, car gas cap etc. Long-term goal:  Reduce overall level, frequency, and intensity of the feelings of anxiety and obsessive-compulsive behaviors so that daily functioning is not                              impaired. Short-term goal: Verbalize an understanding of how thoughts, physical feelings, and behavioral actions contribute to anxiety and its treatment.                            Verbalize an understanding of the role that fearful thinking plays in creating fears, excessive worry, and anxiety / panic.  Anson [place], [facility]

## 2018-11-08 ENCOUNTER — Ambulatory Visit (INDEPENDENT_AMBULATORY_CARE_PROVIDER_SITE_OTHER): Payer: 59 | Admitting: Mental Health

## 2018-11-08 ENCOUNTER — Other Ambulatory Visit: Payer: Self-pay

## 2018-11-08 DIAGNOSIS — F411 Generalized anxiety disorder: Secondary | ICD-10-CM | POA: Diagnosis not present

## 2018-11-08 NOTE — Progress Notes (Signed)
     [facility] Counselor/Therapist Progress Note   Patient ID: Valeen Borys, MRN: [id number]  Date: 11/09/18  Timespent: 57 minutes   Treatment Type: Individual   Mental Status Exam:   Appearance:   casual  Behavior:  wnl  Motor:  wnl  Speech/Language:   Clear and Coherent  Affect:  constricted  Mood:  anxious, congruent  Thought process:  Coherent and Relevant, goal directed  Thought content:    WDL, no SI/HI  Perceptual disturbances:    none  Orientation:  Full (Time, Place, and Person)  Attention:  Good  Concentration:  good  Memory:  WNL  Fund of knowledge:   Good  Insight:    Good  Judgment:   Good  Impulse Control:  developing   Reported Symptoms: Daily anxiety, obsessive thoughts, compulsive behavior, panic attacks  Risk Assessment: Danger to Self:  No Self-injurious Behavior: No Danger to Others: No Duty to Warn:no Physical Aggression / Violence:No  Access to Firearms a concern: No  Gang Involvement:No   Patient / guardian was educated about steps to take if suicide or homicide risk level increases between visits. While future psychiatric events cannot be accurately predicted, the patient does not currently require acute inpatient psychiatric care and does not currently meet [facility] involuntary commitment criteria.   Subjective: Patient engaged in session in no distress. Family relationships process where patient continues to have anxiety related to family expectations specifically her getting married. Some specific expectations and concerns were discussed and patient was able to identify how she was catastrophizing a situation. She shared how she follow through with plans last session where she was to decrease her checking behavior of door locks. She shared how she was unsuccessful and the behavior has continued. We discuss other strategies where she is to leave to her room following the behavior and engage in activities, some were explored also  with patient. discussed thought blocking strategies in detail. Pt plans to followthrough.  Interventions: CBT, Solution Focused and Strength-based  Diagnosis:    ICD-10-CM   1. Generalized anxiety disorder  F41.1      Plan: Patient is to use CBT and coping skills to help manage her daily anxiety and obsessive compulsive behaviors.  Patient to continue to work on decreasing her compulsive behaviors, utilizing cognitive and behavioral skills.   long-term goal:  Reduce overall level, frequency, and intensity of the feelings of anxiety and obsessive-compulsive behaviors so that daily functioning is not                              impaired. Short-term goal: Verbalize an understanding of how thoughts, physical feelings, and behavioral actions contribute to anxiety and its treatment.                            Verbalize an understanding of the role that fearful thinking plays in creating fears, excessive worry, and anxiety / panic.  Anson [place], [facility]

## 2018-11-22 ENCOUNTER — Other Ambulatory Visit: Payer: Self-pay

## 2018-11-22 ENCOUNTER — Ambulatory Visit (INDEPENDENT_AMBULATORY_CARE_PROVIDER_SITE_OTHER): Payer: 59 | Admitting: Mental Health

## 2018-11-22 DIAGNOSIS — F411 Generalized anxiety disorder: Secondary | ICD-10-CM

## 2018-11-22 DIAGNOSIS — F429 Obsessive-compulsive disorder, unspecified: Secondary | ICD-10-CM | POA: Diagnosis not present

## 2018-11-22 NOTE — Progress Notes (Signed)
     [facility] Counselor/Therapist Progress Note   Patient ID: Michaela Pena, MRN: [id number]  Date: 11/22/18  Timespent: 56 minutes   Treatment Type: Individual   Mental Status Exam:   Appearance:   casual  Behavior:  wnl  Motor:  wnl  Speech/Language:   Clear and Coherent  Affect:  constricted  Mood:  anxious, congruent  Thought process:  Coherent and Relevant, goal directed  Thought content:    WDL, no SI/HI  Perceptual disturbances:    none  Orientation:  Full (Time, Place, and Person)  Attention:  Good  Concentration:  good  Memory:  WNL  Fund of knowledge:   Good  Insight:    Good  Judgment:   Good  Impulse Control:  developing   Reported Symptoms: Daily anxiety, obsessive thoughts, compulsive behavior, panic attacks  Risk Assessment: Danger to Self:  No Self-injurious Behavior: No Danger to Others: No Duty to Warn:no Physical Aggression / Violence:No  Access to Firearms a concern: No  Gang Involvement:No   Patient / guardian was educated about steps to take if suicide or homicide risk level increases between visits. While future psychiatric events cannot be accurately predicted, the patient does not currently require acute inpatient psychiatric care and does not currently meet [facility] involuntary commitment criteria.   Subjective: Patient arrived on time for today's session.  She stated that she continues some of her obsessive behaviors.  We explored further, assisting patient and realizing that she has made progress as she is not engaged in the repetitive behaviors as she has been able to decrease the amount of time she has repeated the behaviors.  Family relationships were fully assessed and feelings process.  She continues to have close relationship with her mother and sisters overall.  Some continued strain related to the faith-based differences they have.  Reviewed thought blocking and encouraged patient to continue plans discussed in previous  session with further increase in frequency.   Interventions: CBT, Solution Focused and Strength-based  Diagnosis:    ICD-10-CM   1. Generalized anxiety disorder  F41.1   2. Obsessive-compulsive disorder, unspecified type  F42.9      Plan: Patient is to use CBT and coping skills to help manage her daily anxiety and obsessive compulsive behaviors.  Patient to continue to work on decreasing her compulsive behaviors, utilizing cognitive and behavioral skills.   long-term goal:  Reduce overall level, frequency, and intensity of the feelings of anxiety and obsessive-compulsive behaviors so that daily functioning is not impaired. Short-term goal: Verbalize an understanding of how thoughts, physical feelings, and behavioral actions contribute to anxiety and its treatment.                            Verbalize an understanding of the role that fearful thinking plays in creating fears, excessive worry, and anxiety / panic.  Assessment of progress:  progressing .  Anson [place], [facility]

## 2018-12-06 ENCOUNTER — Other Ambulatory Visit: Payer: Self-pay

## 2018-12-06 ENCOUNTER — Ambulatory Visit (INDEPENDENT_AMBULATORY_CARE_PROVIDER_SITE_OTHER): Payer: 59 | Admitting: Mental Health

## 2018-12-06 DIAGNOSIS — F411 Generalized anxiety disorder: Secondary | ICD-10-CM

## 2018-12-06 NOTE — Progress Notes (Signed)
     [facility] Counselor/Therapist Progress Note   Patient ID: Michaela Pena, MRN: [id number]  Date: 12/06/18  Timespent: 55 minutes   Treatment Type: Individual   Mental Status Exam:   Appearance:   casual  Behavior:  wnl  Motor:  wnl  Speech/Language:   Clear and Coherent  Affect:  constricted  Mood:  anxious, congruent  Thought process:  Coherent and Relevant, goal directed  Thought content:    WDL, no SI/HI  Perceptual disturbances:    none  Orientation:  Full (Time, Place, and Person)  Attention:  Good  Concentration:  good  Memory:  WNL  Fund of knowledge:   Good  Insight:    Good  Judgment:   Good  Impulse Control:  developing   Reported Symptoms: Daily anxiety, obsessive thoughts, compulsive behavior, panic attacks  Risk Assessment: Danger to Self:  No Self-injurious Behavior: No Danger to Others: No Duty to Warn:no Physical Aggression / Violence:No  Access to Firearms a concern: No  Gang Involvement:No   Patient / guardian was educated about steps to take if suicide or homicide risk level increases between visits. While future psychiatric events cannot be accurately predicted, the patient does not currently require acute inpatient psychiatric care and does not currently meet [facility] involuntary commitment criteria.   Subjective:  Patient shared progress.  She stated that her family has a couple visiting with them for the next 6 months to a year approximately.  She stated this related to their Jewish faith as it will allow the couple to integrate into this area with support.  Patient shared how she had to make adjustments she moved to the basement of the home.  She stated that 1+ outcome of the move was it through interruption with her schedule of the obsessive-compulsive behaviors related to door locking.  She stated she has now checking the doors months and then going to bed.  This was facilitated as she did not want her behaviors seen by the  new gas in the home.  Due to patient taking the steps and recognizing how it was less difficult than she thought it would be, provide encouragement and explored steps for patient to take to tackle other areas discussed in previous sessions.  Reviewed STOPP skill to utilize between sessions.  Interventions: CBT, Solution Focused and Strength-based  Diagnosis:    ICD-10-CM   1. Generalized anxiety disorder  F41.1      Plan: Patient is to use CBT and coping skills to help manage her daily anxiety and obsessive compulsive behaviors.  Patient to continue to work on decreasing her compulsive behaviors, utilizing cognitive and behavioral skills.   long-term goal:  Reduce overall level, frequency, and intensity of the feelings of anxiety and obsessive-compulsive behaviors so that daily functioning is not impaired. Short-term goal: Verbalize an understanding of how thoughts, physical feelings, and behavioral actions contribute to anxiety and its treatment.                            Verbalize an understanding of the role that fearful thinking plays in creating fears, excessive worry, and anxiety / panic.  Assessment of progress:  progressing .  Anson [place], [facility]

## 2018-12-14 ENCOUNTER — Other Ambulatory Visit: Payer: Self-pay | Admitting: Nurse Practitioner

## 2018-12-15 ENCOUNTER — Other Ambulatory Visit (HOSPITAL_COMMUNITY)
Admission: RE | Admit: 2018-12-15 | Discharge: 2018-12-15 | Disposition: A | Discharge status: Home / Self Care | Payer: 59 | Source: Ambulatory Visit | Attending: Nurse Practitioner | Admitting: Nurse Practitioner

## 2018-12-15 ENCOUNTER — Encounter: Payer: Self-pay | Admitting: Nurse Practitioner

## 2018-12-15 DIAGNOSIS — Z124 Encounter for screening for malignant neoplasm of cervix: Secondary | ICD-10-CM | POA: Diagnosis not present

## 2018-12-16 ENCOUNTER — Telehealth: Payer: Self-pay | Admitting: Psychiatry

## 2018-12-16 NOTE — Telephone Encounter (Signed)
I agree with you a note of no reason why stimulant would cause increased bruising.  Again sertraline would be the most likely to contribute particularly if she combines it with a nonsteroidal anti-inflammatory.  The significance of bruising from an SSRI is usually not clinically meaningful.

## 2018-12-16 NOTE — Telephone Encounter (Signed)
Pt maybe having some side effects. She thinks it could be from the [place] she is taking.

## 2018-12-18 LAB — CYTOLOGY - PAP
Chlamydia: NEGATIVE
Comment: NEGATIVE
Comment: NORMAL
Diagnosis: NEGATIVE
Neisseria Gonorrhea: NEGATIVE

## 2018-12-19 NOTE — Telephone Encounter (Signed)
Tried to reach patient with information but no answer and mail box full. Will try back.

## 2018-12-20 ENCOUNTER — Ambulatory Visit (INDEPENDENT_AMBULATORY_CARE_PROVIDER_SITE_OTHER): Payer: 59 | Admitting: Mental Health

## 2018-12-20 ENCOUNTER — Other Ambulatory Visit: Payer: Self-pay

## 2018-12-20 DIAGNOSIS — F411 Generalized anxiety disorder: Secondary | ICD-10-CM | POA: Diagnosis not present

## 2018-12-20 NOTE — Progress Notes (Signed)
     [facility] Counselor/Therapist Progress Note   Patient ID: Michaela Pena, MRN: [id number]  Date: 12/20/18  Timespent: 50 minutes   Treatment Type: Individual   Mental Status Exam:   Appearance:   casual  Behavior:  wnl  Motor:  wnl  Speech/Language:   Clear and Coherent  Affect:  euthymic  Mood:  anxious, congruent  Thought process:  Coherent and Relevant, goal directed  Thought content:    WDL, no SI/HI  Perceptual disturbances:    none  Orientation:  Full (Time, Place, and Person)  Attention:  Good  Concentration:  good  Memory:  WNL  Fund of knowledge:   Good  Insight:    Good  Judgment:   Good  Impulse Control:  developing   Reported Symptoms: Daily anxiety, obsessive thoughts, compulsive behavior, panic attacks  Risk Assessment: Danger to Self:  No Self-injurious Behavior: No Danger to Others: No Duty to Warn:no Physical Aggression / Violence:No  Access to Firearms a concern: No  Gang Involvement:No   Patient / guardian was educated about steps to take if suicide or homicide risk level increases between visits. While future psychiatric events cannot be accurately predicted, the patient does not currently require acute inpatient psychiatric care and does not currently meet [facility] involuntary commitment criteria.   Subjective:  Patient arrived on time for today's session.  She stated that she continues to adjust as well as her family to having the couple live in their home while transitioning to this area.  She shared how she has continued to take steps to decrease some of her obsessive behaviors, most notably locking doors excessively.  She continues to struggle with stopping while driving to check her gas.  She continues to make attempts to decrease this behavior systematically but admits the struggle to do so as she has engaged in this behavior chronically for the past few years.  Explored family relationships where patient was able to identify  more history related to the relationship with her father.  Continues to have no contact with him, identifying has minimal effect on her emotionally due to the substance of the relationship and relative history.  Has completed semester finals and has slowly reengage in work.  Explored ways to cope, care for herself, making attempts to get out of the house when possible to go on walks to do stress as well as maintaining a sense of independence and privacy at home due to the ongoing visitors who will probably stay with him for about a year.  Interventions: CBT, Solution Focused and Strength-based  Diagnosis:    ICD-10-CM   1. Generalized anxiety disorder  F41.1      Plan: Patient is to use CBT and coping skills to help manage her daily anxiety and obsessive compulsive behaviors.  Patient to continue to work on decreasing her compulsive behaviors, utilizing cognitive and behavioral skills.   long-term goal:  Reduce overall level, frequency, and intensity of the feelings of anxiety and obsessive-compulsive behaviors so that daily functioning is not impaired. Short-term goal: Verbalize an understanding of how thoughts, physical feelings, and behavioral actions contribute to anxiety and its treatment.                            Verbalize an understanding of the role that fearful thinking plays in creating fears, excessive worry, and anxiety / panic.  Assessment of progress:  progressing .  Anson [place], [facility]

## 2018-12-26 ENCOUNTER — Telehealth: Payer: Self-pay | Admitting: Hematology and Oncology

## 2018-12-26 NOTE — Telephone Encounter (Signed)
Received a new hem referral from Dr. Belva Bertin at [place] for spontaneous bruising. Ms. Michaela Pena cld to schedule a new hem appt w/Dr. Lorenso Courier on 12/22 at 10am. Pt aware to arrive 15 minutes early.

## 2019-01-03 ENCOUNTER — Inpatient Hospital Stay: Payer: 59 | Attending: Hematology and Oncology | Admitting: Hematology and Oncology

## 2019-01-03 ENCOUNTER — Inpatient Hospital Stay: Payer: 59

## 2019-01-03 ENCOUNTER — Encounter: Payer: Self-pay | Admitting: Hematology and Oncology

## 2019-01-03 ENCOUNTER — Other Ambulatory Visit: Payer: Self-pay

## 2019-01-03 VITALS — BP 119/85 | HR 113 | Temp 97.8°F | Resp 16 | Ht 70.0 in | Wt 149.7 lb

## 2019-01-03 DIAGNOSIS — F419 Anxiety disorder, unspecified: Secondary | ICD-10-CM | POA: Insufficient documentation

## 2019-01-03 DIAGNOSIS — T148XXA Other injury of unspecified body region, initial encounter: Secondary | ICD-10-CM

## 2019-01-03 DIAGNOSIS — E039 Hypothyroidism, unspecified: Secondary | ICD-10-CM | POA: Insufficient documentation

## 2019-01-03 DIAGNOSIS — R599 Enlarged lymph nodes, unspecified: Secondary | ICD-10-CM | POA: Insufficient documentation

## 2019-01-03 DIAGNOSIS — R233 Spontaneous ecchymoses: Secondary | ICD-10-CM | POA: Insufficient documentation

## 2019-01-03 DIAGNOSIS — R634 Abnormal weight loss: Secondary | ICD-10-CM

## 2019-01-03 DIAGNOSIS — Z8349 Family history of other endocrine, nutritional and metabolic diseases: Secondary | ICD-10-CM | POA: Diagnosis not present

## 2019-01-03 DIAGNOSIS — Z79899 Other long term (current) drug therapy: Secondary | ICD-10-CM | POA: Insufficient documentation

## 2019-01-03 LAB — TSH: TSH: 2.332 u[IU]/mL (ref 0.308–3.960)

## 2019-01-03 LAB — CMP (CANCER CENTER ONLY)
ALT: 13 U/L (ref 0–44)
AST: 18 U/L (ref 15–41)
Albumin: 4.3 g/dL (ref 3.5–5.0)
Alkaline Phosphatase: 96 U/L (ref 38–126)
Anion gap: 9 (ref 5–15)
BUN: 13 mg/dL (ref 6–20)
CO2: 25 mmol/L (ref 22–32)
Calcium: 9.2 mg/dL (ref 8.9–10.3)
Chloride: 107 mmol/L (ref 98–111)
Creatinine: 0.8 mg/dL (ref 0.44–1.00)
GFR, Est AFR Am: >60 mL/min (ref >60)
GFR, Estimated: >60 mL/min (ref >60)
Glucose, Bld: 94 mg/dL (ref 70–99)
Potassium: 4.5 mmol/L (ref 3.5–5.1)
Sodium: 141 mmol/L (ref 135–145)
Total Bilirubin: 0.4 mg/dL (ref 0.3–1.2)
Total Protein: 7.2 g/dL (ref 6.5–8.1)

## 2019-01-03 LAB — FERRITIN: Ferritin: 34 ng/mL (ref 11–307)

## 2019-01-03 LAB — IRON AND TIBC
Iron: 62 ug/dL (ref 41–142)
Saturation Ratios: 21 % (ref 21–57)
TIBC: 303 ug/dL (ref 236–444)
UIBC: 240 ug/dL (ref 120–384)

## 2019-01-03 LAB — CBC WITH DIFFERENTIAL (CANCER CENTER ONLY)
Abs Immature Granulocytes: 0.02 10*3/uL (ref 0.00–0.07)
Basophils Absolute: 0 10*3/uL (ref 0.0–0.1)
Basophils Relative: 0 %
Eosinophils Absolute: 0.1 10*3/uL (ref 0.0–0.5)
Eosinophils Relative: 1 %
HCT: 42.5 % (ref 36.0–46.0)
Hemoglobin: 14 g/dL (ref 12.0–15.0)
Immature Granulocytes: 0 %
Lymphocytes Relative: 28 %
Lymphs Abs: 1.9 10*3/uL (ref 0.7–4.0)
MCH: 29.9 pg (ref 26.0–34.0)
MCHC: 32.9 g/dL (ref 30.0–36.0)
MCV: 90.8 fL (ref 80.0–100.0)
Monocytes Absolute: 0.3 10*3/uL (ref 0.1–1.0)
Monocytes Relative: 4 %
Neutro Abs: 4.5 10*3/uL (ref 1.7–7.7)
Neutrophils Relative %: 67 %
Platelet Count: 256 10*3/uL (ref 150–400)
RBC: 4.68 MIL/uL (ref 3.87–5.11)
RDW: 12.4 % (ref 11.5–15.5)
WBC Count: 6.8 10*3/uL (ref 4.0–10.5)
nRBC: 0 % (ref 0.0–0.2)

## 2019-01-03 LAB — APTT: aPTT: 28 seconds (ref 24–36)

## 2019-01-03 LAB — PROTIME-INR
INR: 1 (ref 0.8–1.2)
Prothrombin Time: 13 seconds (ref 11.4–15.2)

## 2019-01-03 LAB — LACTATE DEHYDROGENASE: LDH: 177 U/L (ref 98–192)

## 2019-01-03 LAB — SAVE SMEAR (SSMR)

## 2019-01-03 LAB — HIV ANTIBODY (ROUTINE TESTING W REFLEX): HIV Screen 4th Generation wRfx: NONREACTIVE

## 2019-01-03 NOTE — Progress Notes (Signed)
[place] [phone]   Fax:([phone]) [place] NOTE  Patient Care Team: [facility] [place] as PCP - General (Family Medicine)  Hematological/Oncological History # Easy Bruising 1) 01/03/2019: establish care with Dr. Lorenso Courier. Notes 6 months of spontaneous bruising.   CHIEF COMPLAINTS/PURPOSE OF CONSULTATION:  "easy bruising, weight loss "  HISTORY OF PRESENTING ILLNESS:  Michaela Pena 23 y.o. female with medical history significant for hypothyroidism, anxiety, and ADD who presents for evaluation of easy bruising and weight loss.  On review of prior records the patient was seen by her PCP last on 12/21/2018.  At that time the PCP underwent work-up for concern of easy bruising.  A PT/INR and PTT were ordered and found to be normal.  Additionally the patient's CBC was without abnormalities.  Patient was noted to have elevations in TSH with a normal T3 and T4.  She is actively being treated for hypothyroidism with levothyroxine.  Additionally she is also taking Evekeo for ADD.  During her last visit she noted that she had lost 20 pounds in just a few months and also reported that she had spontaneous bruising of her lower extremities over the last 6 months.  On discussion today the patient notes that she has developed approximately 2-1/2 inch diameter bruises over her lower extremities that were typically dark purple and would fade to greenish-yellow.  She reports that she would have several on her lower extremities at the same time.  She maybe had 1 or 2 on her abdomen but none reported on the arms or chest.  During this time.  She is also had approximate 20 pound weight loss without intentional dieting.  Just since her last visit with her PCP she is declined from a weight of 152 down to 149.  She notes that she is sweaty in the morning, but not soaked.  She reports that she is not a sweaty person this is unusual for her.   Additionally she reports that her periods have been sporadic, however when they occur she only goes to approximately 2 pads per day.  On further discussion she notes that she is not having any other overt sources of bleeding including nosebleeds, dark stools, or gum bleeding.  She currently eats a normal diet with no restriction and includes meats, and veggies.  Distally she is concerned about a lymph node which was pronounced in her neck.  She reports that this presented approximately 2 weeks ago and has slowly declined in size.  She notes that it was initially a size of a large chickpea and now is a much smaller size.  She denies having any fevers, chills, sweats, nausea, vomiting or diarrhea.  Full 10 point ROS is otherwise negative.  MEDICAL HISTORY:  Past Medical History:  Diagnosis Date  . ADD (attention deficit disorder)   . Anxiety   . Thyroid disease     SURGICAL HISTORY: Past Surgical History:  Procedure Laterality Date  . WISDOM TOOTH EXTRACTION      SOCIAL HISTORY: Social History   Socioeconomic History  . Marital status: Single    Spouse name: Not on file  . Number of children: Not on file  . Years of education: Not on file  . Highest education level: Not on file  Occupational History  . Occupation: History Major at [employer]  Tobacco Use  . Smoking status: Never Smoker  . Smokeless tobacco: Never Used  Substance and Sexual Activity  . Alcohol use:  Never  . Drug use: Not on file  . Sexual activity: Not on file  Other Topics Concern  . Not on file  Social History Narrative  . Not on file   Social Determinants of Health   Financial Resource Strain:   . Difficulty of Paying Living Expenses: Not on file  Food Insecurity:   . Worried About Charity fundraiser in the Last Year: Not on file  . Ran Out of Food in the Last Year: Not on file  Transportation Needs:   . Lack of Transportation (Medical): Not on file  . Lack of Transportation (Non-Medical): Not on file    Physical Activity:   . Days of Exercise per Week: Not on file  . Minutes of Exercise per Session: Not on file  Stress:   . Feeling of Stress : Not on file  Social Connections:   . Frequency of Communication with Friends and Family: Not on file  . Frequency of Social Gatherings with Friends and Family: Not on file  . Attends Religious Services: Not on file  . Active Member of Clubs or Organizations: Not on file  . Attends Archivist Meetings: Not on file  . Marital Status: Not on file  Intimate Partner Violence:   . Fear of Current or Ex-Partner: Not on file  . Emotionally Abused: Not on file  . Physically Abused: Not on file  . Sexually Abused: Not on file    FAMILY HISTORY: Family History  Problem Relation Age of Onset  . Anxiety disorder Mother   . Depression Mother   . Hypothyroidism Mother   . Anxiety disorder Maternal Grandmother   . Depression Maternal Grandmother   . Polycystic kidney disease Paternal Grandfather     ALLERGIES:  has No Known Allergies.  MEDICATIONS:  Current Outpatient Medications  Medication Sig Dispense Refill  . levothyroxine (SYNTHROID) 25 MCG tablet Take 25 mcg by mouth daily before breakfast.    . Amphetamine Sulfate (EVEKEO) 10 MG TABS Take 10 mg by mouth every morning. 30 tablet 0  . buPROPion (WELLBUTRIN XL) 150 MG 24 hr tablet Take 1 tablet (150 mg total) by mouth every morning. 90 tablet 1  . sertraline (ZOLOFT) 100 MG tablet Take 2 tablets (200 mg total) by mouth daily. 2 po qd 180 tablet 1   No current facility-administered medications for this visit.    REVIEW OF SYSTEMS:   Constitutional: ( - ) fevers, ( - )  chills , ( - ) night sweats Eyes: ( - ) blurriness of vision, ( - ) double vision, ( - ) watery eyes Ears, nose, mouth, throat, and face: ( - ) mucositis, ( - ) sore throat Respiratory: ( - ) cough, ( - ) dyspnea, ( - ) wheezes Cardiovascular: ( - ) palpitation, ( - ) chest discomfort, ( - ) lower extremity  swelling Gastrointestinal:  ( - ) nausea, ( - ) heartburn, ( - ) change in bowel habits Skin: ( - ) abnormal skin rashes Lymphatics: ( - ) new lymphadenopathy, ( - ) easy bruising Neurological: ( - ) numbness, ( - ) tingling, ( - ) new weaknesses Behavioral/Psych: ( - ) mood change, ( - ) new changes  All other systems were reviewed with the patient and are negative.  PHYSICAL EXAMINATION: ECOG PERFORMANCE STATUS: 0 - Asymptomatic  Vitals:   01/03/19 1002  BP: 119/85  Pulse: (!) 113  Resp: 16  Temp: 97.8 F (36.6 C)  SpO2: 100%  Filed Weights   01/03/19 1002  Weight: 149 lb 11.2 oz (67.9 kg)    GENERAL: well appearing young Caucasian female in NAD  SKIN: skin color, texture, turgor are normal, no rashes or significant lesions EYES: conjunctiva are pink and non-injected, sclera clear NECK: supple, non-tender LYMPH:  Small pea sized lymph node palpable at superior portion of right anterior cervical chain, just inferior to the jaw. Otherwise no palpable lymphadenopathy in the cervical, axillary or supraclavicular BREAST: declined breast exam today.  LUNGS: clear to auscultation and percussion with normal breathing effort HEART: regular rate & rhythm and no murmurs and no lower extremity edema ABDOMEN: soft, non-tender, non-distended, no HSM. Musculoskeletal: no cyanosis of digits and no clubbing. No lymphadenopathy noted on right thigh (inspected on patient request).  PSYCH: alert & oriented x 3, fluent speech NEURO: no focal motor/sensory deficits  LABORATORY DATA:  I have reviewed the data as listed Lab Results  Component Value Date   WBC 6.8 01/03/2019   HGB 14.0 01/03/2019   HCT 42.5 01/03/2019   MCV 90.8 01/03/2019   PLT 256 01/03/2019   NEUTROABS 4.5 01/03/2019    PATHOLOGY: 12/14/2018: negative PAP smear.   BLOOD FILM: Unable to review, lost slide. Will check at next f/u visit.   RADIOGRAPHIC STUDIES: None relevant to review. No results  found.  ASSESSMENT & PLAN Abigayl Hor 23 y.o. female with medical history significant for hypothyroidism, anxiety, and ADD who presents for evaluation of easy bruising and weight loss.  After review of previous records and exam of the patient her findings are most concerning for her easy bruising, weight loss, and singularly enlarged right anterior cervical chain lymph node.  To approach these problems we will discuss them individually, though it is possible that they are all related.  In terms of the easy bruising, this is often an unrewarding work-up and rarely represents a hypocoagulable state when limited only to the extremities.  Prior work-up for her has revealed that she has normal coagulation studies and a normal platelet count.  Fortunately she has no other concerning signs for a bleeding condition.  In attempting to unify her diagnoses it is possible the hypothyroidism was responsible for the development of her lower extremity bruising ( J Clin Endocrinol Metab. 2007 Jul;92(7):2415-20.).  This bruising has subsequently begun to fade, potentially in the setting of recently beginning levothyroxine.  Additionally nutritional abnormalities can cause this and therefore we will order a CMP to assess her nutritional status as well as a vitamin C which can cause lower extremity bruising if deficient.  In terms of the weight loss the patient's weight loss has been 20 to 25 pounds over the last several months.  Many of the patient's current medications have known side effects of causing weight loss.  Her ADD medication is an amphetamine and the levothyroxine has a well-known side effect of causing weight loss.  That being said this is a considerably large drop in weight, but is not associated with any other clear symptoms including nausea, vomiting, diarrhea, overt B symptoms, or lack of appetite.  As such I have cautiously suggest that this is caused by her medications though I would recommend  continued close monitoring of her weight and vigilance for any symptoms that may reveal an alternative underlying etiology.  As for the anterior cervical chain lymph node she reports that it has been present for only about 2 weeks and has been slowly decreasing in size.  We did a thorough exam  of her other lymph nodes including anterior cervical chain bilaterally, posterior cervical chain occipital, supraclavicular, axillary, as well as a area that was concerning her on her right thigh.  Overall these findings are reassuring that this was a transient flare of the lymph node and it has subsequently begun to recede.  Once again I would recommend vigilance in palpating for increase in the lymph node size and increase in other lymph nodes.  Overall I do find her constellation of symptoms concerning and I would recommend vigilance to assure that these do not continue to progress. All of these findings have benign possible explanations that are common. In the event that there is a marked change in her weight or the development of new symptom I would strongly recommend that she represent to our clinic immediately.  I will do a peripheral blood film review as well as check an LDH to assure no clear signs of hematological malignancy.  Given her otherwise normal blood counts I have a very low suspicion that hematological malignancy is the current source of her findings.  That being said her mother was diagnosed at a very young age with BRCA 1 and2 negative, triple negative breast cancer and therefore this patient may be at risk for malignancy at a young age.  I do not recommend pan scanning with a CT scan or other testing such as mammogram at this time as the typical recommendation for unexplained weight loss is age-appropriate cancer screenings with a focus on testing for localized symptoms.  That being said we will have the patient return in 3 months time for continued evaluation of her weight, the lymph node, and the  status of her bruising.  #Easy Bruising --this has been improving over the last few weeks. Minimal bruising evident on exam today. Previously noted to be limited to the lower extremity. --no other overt sources of bleeding, no heavy menstrual cycles or dark stools --today will check CBC, CMP, Vitamin C, PT/INR and PTT.  --bruising is a known side effect of hypothyroidism. With administration of levothyroxine it is possible her symptoms are improving. --continue to monitor  #Weight Loss --reportedly lost 20-25 lbs in the last few months unintentionally --possible side effect of her ADD medications and levothyroxine.  --no clear findings that would raise alarm for a focal issue (inflammatory, infectious, or malignant). --today will order HIV, LDH, and TSH  --continue to monitor  #Enlarged Lymph Node --modestly enlarged anterior cervical chain lymph node, decreasing in size and present over the last 2 weeks --typically a lymph node that is decreasing in size and present for <1 month does not require further intervention.  -- if new lymph nodes were to arise or this lymph node were to get suddenly larger, would recommend ENT evaluation for biopsy. --possibly secondary to a subclinical infection or other transient inflammatory stimulus. --continue to monitor.  RTC in 3 months to assure no new symptoms and no further weight loss.    Orders Placed This Encounter  Procedures  . CBC with Differential (Cancer Center Only)    Standing Status:   Future    Number of Occurrences:   1    Standing Expiration Date:   01/03/2020  . Save Smear (SSMR)    Standing Status:   Future    Number of Occurrences:   1    Standing Expiration Date:   01/03/2020  . CMP ([facility] only)    Standing Status:   Future    Number of Occurrences:  1    Standing Expiration Date:   01/03/2020  . Lactate dehydrogenase (LDH)    Standing Status:   Future    Number of Occurrences:   1    Standing Expiration Date:    01/03/2020  . TSH    Standing Status:   Future    Number of Occurrences:   1    Standing Expiration Date:   01/03/2020  . Protime-INR    Standing Status:   Future    Number of Occurrences:   1    Standing Expiration Date:   01/03/2020  . APTT    Standing Status:   Future    Number of Occurrences:   1    Standing Expiration Date:   01/03/2020  . Vitamin C  . Iron and TIBC    Standing Status:   Future    Number of Occurrences:   1    Standing Expiration Date:   01/03/2020  . Ferritin    Standing Status:   Future    Number of Occurrences:   1    Standing Expiration Date:   01/03/2020  . HIV antibody (with reflex)    Standing Status:   Future    Number of Occurrences:   1    Standing Expiration Date:   01/03/2020    All questions were answered. The patient knows to call the clinic with any problems, questions or concerns.  A total of more than 60 minutes were spent on this encounter and over half of that time was spent on counseling and coordination of care as outlined above.   Ledell Peoples, MD Department of Hematology/Oncology [facility] Phone: [phone] Pager: [phone] Email: [email or web address]  01/03/2019 7:11 PM   Literature Review:  Squizzato A, Romualdi E, Bller HR, Gerdes VE. Clinical review: Thyroid dysfunction and effects on coagulation and fibrinolysis: a systematic review. J Clin Endocrinol Metab. 2007 Jul;92(7):2415-20. doi: 10.1210/jc.2007-0199. Epub 2007 Apr 17. PMID: 49675916. --his analysis confirmed that clinically overt hyperthyroidism and hypothyroidism modify the coagulation-fibrinolytic balance, indicating that thyroid hormone excess or deficit is the probable main pathophysiological mechanism. Patients with overt hypothyroidism and overt hyperthyroidism appear to have an increased risk of bleeding and of thrombosis, respectively.

## 2019-01-04 ENCOUNTER — Telehealth: Payer: Self-pay | Admitting: Hematology and Oncology

## 2019-01-04 ENCOUNTER — Ambulatory Visit (INDEPENDENT_AMBULATORY_CARE_PROVIDER_SITE_OTHER): Payer: 59 | Admitting: Mental Health

## 2019-01-04 DIAGNOSIS — F411 Generalized anxiety disorder: Secondary | ICD-10-CM | POA: Diagnosis not present

## 2019-01-04 NOTE — Progress Notes (Signed)
     [facility] Counselor/Therapist Progress Note   Patient ID: Michaela Pena, MRN: [id number]  Date: 01/04/19  Timespent: 51 minutes   Treatment Type: Individual   Mental Status Exam:   Appearance:   casual  Behavior:  wnl  Motor:  wnl  Speech/Language:   Clear and Coherent  Affect:  euthymic  Mood:  anxious, congruent  Thought process:  Coherent and Relevant, goal directed  Thought content:    WDL, no SI/HI  Perceptual disturbances:    none  Orientation:  Full (Time, Place, and Person)  Attention:  Good  Concentration:  good  Memory:  WNL  Fund of knowledge:   Good  Insight:    Good  Judgment:   Good  Impulse Control:  developing   Reported Symptoms: Daily anxiety, obsessive thoughts, compulsive behavior, panic attacks  Risk Assessment: Danger to Self:  No Self-injurious Behavior: No Danger to Others: No Duty to Warn:no Physical Aggression / Violence:No  Access to Firearms a concern: No  Gang Involvement:No   Patient / guardian was educated about steps to take if suicide or homicide risk level increases between visits. While future psychiatric events cannot be accurately predicted, the patient does not currently require acute inpatient psychiatric care and does not currently meet [facility] involuntary commitment criteria.   Subjective:  Patient shared progress.  Stated that she went on a recent road trip about 5 hours away to see a cousin with her sisters.  She stated that her sisters tease her at times due to noticing gas Cars That Are Open While Driving.  They Do Not Allow Patient to Drive Due To Her Needing to Have Frequent Stops to Check Their Gas Tank cap.  Patient shared how this did elicit some checking behavior where she was looking in mirrors to make sure theirs was closed.  We continue to work with patient from a cognitive behavioral framework assisting her in identifying calming self talk as well as reframing with evidence-based information that  eliminates any safety concerns related to this fear.  Patient plans to continue to follow through between sessions.  Explored family relationships, with a focus on her relationship with her mother.  Feels her mother continues to have some hope for her to go through [place] but patient verbalizes her continued avoidance of this process presently in the future.  Interventions: CBT, Solution Focused and Strength-based  Diagnosis:    ICD-10-CM   1. Generalized anxiety disorder  F41.1      Plan: Patient is to use CBT and coping skills to help manage her daily anxiety and obsessive compulsive behaviors.  Patient to continue to work on decreasing her compulsive behaviors, utilizing cognitive and behavioral skills.   long-term goal:   Reduce overall level, frequency, and intensity of the feelings of anxiety and obsessive-compulsive behaviors so that daily functioning is not impaired.  Short-term goal:  Verbalize an understanding of how thoughts, physical feelings, and behavioral actions contribute to anxiety and its treatment. Verbalize an understanding of the role that fearful thinking plays in creating fears, excessive worry, and anxiety / panic.  Assessment of progress:  progressing .  Anson [place], [facility]

## 2019-01-04 NOTE — Telephone Encounter (Signed)
Scheduled per los. Called and spoke with patient. Confirmed appt 

## 2019-01-09 LAB — VITAMIN C: Vitamin C: 0.5 mg/dL (ref 0.4–2.0)

## 2019-01-17 ENCOUNTER — Ambulatory Visit (INDEPENDENT_AMBULATORY_CARE_PROVIDER_SITE_OTHER): Payer: 59 | Admitting: Mental Health

## 2019-01-17 ENCOUNTER — Other Ambulatory Visit: Payer: Self-pay

## 2019-01-17 DIAGNOSIS — F411 Generalized anxiety disorder: Secondary | ICD-10-CM

## 2019-01-17 DIAGNOSIS — F900 Attention-deficit hyperactivity disorder, predominantly inattentive type: Secondary | ICD-10-CM | POA: Diagnosis not present

## 2019-01-17 NOTE — Progress Notes (Signed)
     [facility] Counselor/Therapist Progress Note   Patient ID: Michaela Pena, MRN: [id number]  Date: 01/17/19  Timespent:  50 minutes   Treatment Type: Individual   Mental Status Exam:   Appearance:   casual  Behavior:  wnl  Motor:  wnl  Speech/Language:   Clear and Coherent  Affect:  euthymic  Mood:  anxious, congruent  Thought process:  Coherent and Relevant, goal directed  Thought content:    WDL, no SI/HI  Perceptual disturbances:    none  Orientation:  Full (Time, Place, and Person)  Attention:  Good  Concentration:  good  Memory:  WNL  Fund of knowledge:   Good  Insight:    Good  Judgment:   Good  Impulse Control:  developing   Reported Symptoms: Daily anxiety, obsessive thoughts, compulsive behavior, panic attacks  Risk Assessment: Danger to Self:  No Self-injurious Behavior: No Danger to Others: No Duty to Warn:no Physical Aggression / Violence:No  Access to Firearms a concern: No  Gang Involvement:No   Patient / guardian was educated about steps to take if suicide or homicide risk level increases between visits. While future psychiatric events cannot be accurately predicted, the patient does not currently require acute inpatient psychiatric care and does not currently meet [facility] involuntary commitment criteria.   Subjective:  Patient arrived on time for session.  Shared recent progress.  Continues to struggle with some of her obsessive compulsive behaviors.  She focus on her gas Checking behavior as this occurred again recently as she her sister to [place].  She stated that she is not allowing for quite some time driving and had to take stops on the way and on the way back to check.  She verbalized how she knows that it is safe, struggles to remind herself of this and has moments of panic feelings.  We reviewed graded exposure discussing in detail.  She plans to follow through between sessions.  Continues to have some mild relational stress at  times with her mother, they are out of alignment with their religious faith as patient shares differences they have.  Family relationships are going well overall continues to have no contact with her father some recent attempts by their stepmother to reach out to try to build a relationship but at this point, patient and her sisters are reluctant due to their relational history with her father.  Interventions: CBT, Solution Focused and Strength-based  Diagnosis:    ICD-10-CM   1. Generalized anxiety disorder  F41.1   2. Attention deficit hyperactivity disorder (ADHD), predominantly inattentive type  F90.0      Plan: Patient is to use CBT and coping skills to help manage her daily anxiety and obsessive compulsive behaviors.  Patient to continue to work on decreasing her compulsive behaviors, utilizing cognitive and behavioral skills.   long-term goal:   Reduce overall level, frequency, and intensity of the feelings of anxiety and obsessive-compulsive behaviors so that daily functioning is not impaired.  Short-term goal:  Verbalize an understanding of how thoughts, physical feelings, and behavioral actions contribute to anxiety and its treatment. Verbalize an understanding of the role that fearful thinking plays in creating fears, excessive worry, and anxiety / panic.  Assessment of progress:  progressing .  Waldron Session, [facility]

## 2019-01-25 ENCOUNTER — Encounter: Payer: Self-pay | Admitting: Psychiatry

## 2019-01-25 ENCOUNTER — Ambulatory Visit (INDEPENDENT_AMBULATORY_CARE_PROVIDER_SITE_OTHER): Payer: 59 | Admitting: Psychiatry

## 2019-01-25 DIAGNOSIS — F3342 Major depressive disorder, recurrent, in full remission: Secondary | ICD-10-CM

## 2019-01-25 DIAGNOSIS — F411 Generalized anxiety disorder: Secondary | ICD-10-CM

## 2019-01-25 DIAGNOSIS — F900 Attention-deficit hyperactivity disorder, predominantly inattentive type: Secondary | ICD-10-CM | POA: Diagnosis not present

## 2019-01-25 MED ORDER — AMPHETAMINE SULFATE 10 MG PO TABS: 10.0000 mg | ORAL_TABLET | Freq: Every morning | ORAL | 0 refills | Status: DC

## 2019-01-25 MED ORDER — SERTRALINE HCL 100 MG PO TABS: 200.0000 mg | ORAL_TABLET | Freq: Every day | ORAL | 1 refills | Status: DC

## 2019-01-25 MED ORDER — BUPROPION HCL ER (XL) 150 MG PO TB24: 150.0000 mg | ORAL_TABLET | ORAL | 1 refills | Status: DC

## 2019-01-25 NOTE — Progress Notes (Signed)
Michaela Pena [id number] 07-Sep-1995 24 y.o.  Virtual Visit via Video Note  I connected with pt @ on 01/25/19 at  8:00 AM EST by a video enabled telemedicine application and verified that I am speaking with the correct person using two identifiers.   I discussed the limitations of evaluation and management by telemedicine and the availability of in person appointments. The patient expressed understanding and agreed to proceed.  I discussed the assessment and treatment plan with the patient. The patient was provided an opportunity to ask questions and all were answered. The patient agreed with the plan and demonstrated an understanding of the instructions.   The patient was advised to call back or seek an in-person evaluation if the symptoms worsen or if the condition fails to improve as anticipated.  I provided 20 minutes of non-face-to-face time during this encounter.  The patient was located at home.  The provider was located at [place].   Michaela Pena, PMHNP   Subjective:   Patient ID:  Michaela Pena is a 24 y.o. (DOB 07-24-1995) female.  Chief Complaint:  Chief Complaint  Patient presents with  . Follow-up    ADD, Anxiety, Depression    HPI Michaela Pena presents for follow-up of anxiety, depression, and ADD. Denies depressed mood. Reports that anxiety has been manageable. Obsessive thoughts consistent with baseline. Denies excessive worry. She reports adequate concentration with Eve[place]o. She reports that effect of Evekeo is lasting 5-6 hours. She reports that her energy has been fine. She reports adequate sleep. No change in appetite. Denies SI.   Has not returned to school yet and will re-start classes next week. Reports that her contract work has been slow.   Review of Systems:  Review of Systems  Cardiovascular: Negative for palpitations.  Gastrointestinal: Negative.   Endocrine:       Started synthroid since last visit   Musculoskeletal: Negative for gait problem.  Neurological: Negative for tremors.  Hematological: Does not bruise/bleed easily.  Psychiatric/Behavioral:       Please refer to HPI    Medications:Reviewed  Current Outpatient Medications  Medication Sig Dispense Refill  . levothyroxine (SYNTHROID) 25 MCG tablet Take 25 mcg by mouth daily before breakfast.    . Amphetamine Sulfate (EVEKEO) 10 MG TABS Take 10 mg by mouth every morning. 30 tablet 0  . [START ON 02/22/2019] Amphetamine Sulfate (EVEKEO) 10 MG TABS Take 10 mg by mouth every morning. 30 tablet 0  . [START ON 03/22/2019] Amphetamine Sulfate (EVEKEO) 10 MG TABS Take 10 mg by mouth every morning. 30 tablet 0  . buPROPion (WELLBUTRIN XL) 150 MG 24 hr tablet Take 1 tablet (150 mg total) by mouth every morning. 90 tablet 1  . sertraline (ZOLOFT) 100 MG tablet Take 2 tablets (200 mg total) by mouth daily. 2 po qd 180 tablet 1   No current facility-administered medications for this visit.    Medication Side Effects: None  Allergies: No Known Allergies  Past Medical History:  Diagnosis Date  . ADD (attention deficit disorder)   . Anxiety   . Thyroid disease     Family History  Problem Relation Age of Onset  . Anxiety disorder Mother   . Depression Mother   . Hypothyroidism Mother   . Anxiety disorder Maternal Grandmother   . Depression Maternal Grandmother   . Polycystic kidney disease Paternal Grandfather     Social History   Socioeconomic History  . Marital status: Single    Spouse name: Not  on file  . Number of children: Not on file  . Years of education: Not on file  . Highest education level: Not on file  Occupational History  . Occupation: History Major at [employer]  Tobacco Use  . Smoking status: Never Smoker  . Smokeless tobacco: Never Used  Substance and Sexual Activity  . Alcohol use: Never  . Drug use: Not on file  . Sexual activity: Not on file  Other Topics Concern  . Not on file  Social History  Narrative  . Not on file   Social Determinants of Health   Financial Resource Strain:   . Difficulty of Paying Living Expenses: Not on file  Food Insecurity:   . Worried About Programme researcher, broadcasting/film/video in the Last Year: Not on file  . Ran Out of Food in the Last Year: Not on file  Transportation Needs:   . Lack of Transportation (Medical): Not on file  . Lack of Transportation (Non-Medical): Not on file  Physical Activity:   . Days of Exercise per Week: Not on file  . Minutes of Exercise per Session: Not on file  Stress:   . Feeling of Stress : Not on file  Social Connections:   . Frequency of Communication with Friends and Family: Not on file  . Frequency of Social Gatherings with Friends and Family: Not on file  . Attends Religious Services: Not on file  . Active Member of Clubs or Organizations: Not on file  . Attends Banker Meetings: Not on file  . Marital Status: Not on file  Intimate Partner Violence:   . Fear of Current or Ex-Partner: Not on file  . Emotionally Abused: Not on file  . Physically Abused: Not on file  . Sexually Abused: Not on file    Past Medical History, Surgical history, Social history, and Family history were reviewed and updated as appropriate.   Please see review of systems for further details on the patient's review from today.   Objective:   Physical Exam:  Wt 146 lb (66.2 kg)   BMI 20.95 kg/m   Physical Exam Neurological:     Mental Status: She is alert and oriented to person, place, and time.     Cranial Nerves: No dysarthria.  Psychiatric:        Attention and Perception: Attention and perception normal.        Mood and Affect: Mood normal.        Speech: Speech normal.        Behavior: Behavior is cooperative.        Thought Content: Thought content normal. Thought content is not paranoid or delusional. Thought content does not include homicidal or suicidal ideation. Thought content does not include homicidal or suicidal  plan.        Cognition and Memory: Cognition and memory normal.        Judgment: Judgment normal.     Comments: Insight intact     Lab Review:     Component Value Date/Time   NA 141 01/03/2019 1125   K 4.5 01/03/2019 1125   CL 107 01/03/2019 1125   CO2 25 01/03/2019 1125   GLUCOSE 94 01/03/2019 1125   BUN 13 01/03/2019 1125   CREATININE 0.80 01/03/2019 1125   CALCIUM 9.2 01/03/2019 1125   PROT 7.2 01/03/2019 1125   ALBUMIN 4.3 01/03/2019 1125   AST 18 01/03/2019 1125   ALT 13 01/03/2019 1125   ALKPHOS 96 01/03/2019 1125  BILITOT 0.4 01/03/2019 1125   GFRNONAA >60 01/03/2019 1125   GFRAA >60 01/03/2019 1125       Component Value Date/Time   WBC 6.8 01/03/2019 1125   RBC 4.68 01/03/2019 1125   HGB 14.0 01/03/2019 1125   HCT 42.5 01/03/2019 1125   PLT 256 01/03/2019 1125   MCV 90.8 01/03/2019 1125   MCH 29.9 01/03/2019 1125   MCHC 32.9 01/03/2019 1125   RDW 12.4 01/03/2019 1125   LYMPHSABS 1.9 01/03/2019 1125   MONOABS 0.3 01/03/2019 1125   EOSABS 0.1 01/03/2019 1125   BASOSABS 0.0 01/03/2019 1125    No results found for: POCLITH, LITHIUM   No results found for: PHENYTOIN, PHENOBARB, VALPROATE, CBMZ   .res Assessment: Plan:   Will continue current plan of care since target signs and symptoms are well controlled without any tolerability issues. Pt to f/u in 3 months or sooner if clinically indicated.  Recommend continuing psychotherapy with Elio Forget, LPC.  Patient advised to contact office with any questions, adverse effects, or acute worsening in signs and symptoms.  Junette was seen today for follow-up.  Diagnoses and all orders for this visit:  Attention deficit hyperactivity disorder (ADHD), predominantly inattentive type -     Amphetamine Sulfate (EVEKEO) 10 MG TABS; Take 10 mg by mouth every morning. -     Amphetamine Sulfate (EVEKEO) 10 MG TABS; Take 10 mg by mouth every morning. -     Amphetamine Sulfate (EVEKEO) 10 MG TABS; Take 10 mg by mouth  every morning.  Recurrent major depressive disorder, in full remission (HCC) -     buPROPion (WELLBUTRIN XL) 150 MG 24 hr tablet; Take 1 tablet (150 mg total) by mouth every morning. -     sertraline (ZOLOFT) 100 MG tablet; Take 2 tablets (200 mg total) by mouth daily. 2 po qd  Generalized anxiety disorder -     sertraline (ZOLOFT) 100 MG tablet; Take 2 tablets (200 mg total) by mouth daily. 2 po qd     Please see After Visit Summary for patient specific instructions.  Future Appointments  Date Time Provider Department Center  01/31/2019  1:00 PM Waldron Session, [facility] None  02/14/2019  1:00 PM Waldron Session, [facility] None  02/28/2019  9:00 AM Waldron Session, [facility] None  04/03/2019  8:30 AM Jaci Standard, MD [facility] None  04/03/2019  9:15 AM [facility] LAB 5 [facility] None    No orders of the defined types were placed in this encounter.     -------------------------------

## 2019-01-31 ENCOUNTER — Ambulatory Visit (INDEPENDENT_AMBULATORY_CARE_PROVIDER_SITE_OTHER): Payer: 59 | Admitting: Mental Health

## 2019-01-31 ENCOUNTER — Other Ambulatory Visit: Payer: Self-pay

## 2019-01-31 DIAGNOSIS — F429 Obsessive-compulsive disorder, unspecified: Secondary | ICD-10-CM | POA: Diagnosis not present

## 2019-01-31 DIAGNOSIS — F900 Attention-deficit hyperactivity disorder, predominantly inattentive type: Secondary | ICD-10-CM | POA: Diagnosis not present

## 2019-01-31 NOTE — Progress Notes (Signed)
     [facility] Counselor/Therapist Progress Note   Patient ID: Michaela Pena, MRN: [id number]  Date: 01/31/19  Timespent:  51 minutes   Treatment Type: Individual   Mental Status Exam:   Appearance:   casual  Behavior:  wnl  Motor:  wnl  Speech/Language:   Clear and Coherent  Affect:  euthymic  Mood:  anxious, congruent  Thought process:  Coherent and Relevant, goal directed  Thought content:    WDL, no SI/HI  Perceptual disturbances:    none  Orientation:  Full (Time, Place, and Person)  Attention:  Good  Concentration:  good  Memory:  WNL  Fund of knowledge:   Good  Insight:    Good  Judgment:   Good  Impulse Control:  developing   Reported Symptoms: Daily anxiety, obsessive thoughts, compulsive behavior, panic attacks  Risk Assessment: Danger to Self:  No Self-injurious Behavior: No Danger to Others: No Duty to Warn:no Physical Aggression / Violence:No  Access to Firearms a concern: No  Gang Involvement:No   Patient / guardian was educated about steps to take if suicide or homicide risk level increases between visits. While future psychiatric events cannot be accurately predicted, the patient does not currently require acute inpatient psychiatric care and does not currently meet [facility] involuntary commitment criteria.   Subjective:  Patient arrived on time for today's session.  She shared progress as she continues to cope with some obsessive behaviors such as checking her gas But this is lowered due to less frequent driving recently.  Patient continues to have avoidant behaviors such as elevators.  She also has continued checking locks multiple times but this again has decreased as stated in the last session.  We reviewed cognitive concepts such as calming self talk, logical-based thinking to ground herself as well as exposure efforts to the stressors toward gradual decline in severity.  Continues to have some mild relational strain at home as her  mother will correct her regarding some of her clothing choices that are consistent with their faith.  Patient gets frustrated with this behavior as she does not have the exact believes that her mother does and goes on to vent some of these frustrations in session.  Overall, stress at home is low, the college semester starting and she appears highly motivated to do well academically toward graduation.  Interventions: CBT, Solution Focused and Strength-based  Diagnosis:    ICD-10-CM   1. Attention deficit hyperactivity disorder (ADHD), predominantly inattentive type  F90.0   2. Obsessive-compulsive disorder, unspecified type  F42.9      Plan: Patient is to use CBT and coping skills to help manage her daily anxiety and obsessive compulsive behaviors.  Patient to continue to work on decreasing her compulsive behaviors, utilizing cognitive and behavioral skills.   long-term goal:   Reduce overall level, frequency, and intensity of the feelings of anxiety and obsessive-compulsive behaviors so that daily functioning is not impaired.  Short-term goal:  Verbalize an understanding of how thoughts, physical feelings, and behavioral actions contribute to anxiety and its treatment. Verbalize an understanding of the role that fearful thinking plays in creating fears, excessive worry, and anxiety / panic.  Assessment of progress:  progressing .  Waldron Session, [facility]

## 2019-02-12 ENCOUNTER — Other Ambulatory Visit: Payer: Self-pay | Admitting: Psychiatry

## 2019-02-12 DIAGNOSIS — F3342 Major depressive disorder, recurrent, in full remission: Secondary | ICD-10-CM

## 2019-02-12 DIAGNOSIS — F411 Generalized anxiety disorder: Secondary | ICD-10-CM

## 2019-02-14 ENCOUNTER — Ambulatory Visit: Payer: 59 | Admitting: Mental Health

## 2019-02-20 ENCOUNTER — Ambulatory Visit (INDEPENDENT_AMBULATORY_CARE_PROVIDER_SITE_OTHER): Payer: 59 | Admitting: Mental Health

## 2019-02-20 ENCOUNTER — Other Ambulatory Visit: Payer: Self-pay

## 2019-02-20 DIAGNOSIS — F429 Obsessive-compulsive disorder, unspecified: Secondary | ICD-10-CM

## 2019-02-20 DIAGNOSIS — F900 Attention-deficit hyperactivity disorder, predominantly inattentive type: Secondary | ICD-10-CM | POA: Diagnosis not present

## 2019-02-20 NOTE — Progress Notes (Signed)
     [facility] Counselor/Therapist Progress Note   Patient ID: Michaela Pena, MRN: [id number]  Date: 02/20/19  Timespent:  51 minutes   Treatment Type: Individual   Mental Status Exam:   Appearance:   casual  Behavior:  wnl  Motor:  wnl  Speech/Language:   Clear and Coherent  Affect:  euthymic  Mood:  anxious, congruent  Thought process:  Coherent and Relevant, goal directed  Thought content:    WDL, no SI/HI  Perceptual disturbances:    none  Orientation:  Full (Time, Place, and Person)  Attention:  Good  Concentration:  good  Memory:  WNL  Fund of knowledge:   Good  Insight:    Good  Judgment:   Good  Impulse Control:  developing   Reported Symptoms: Daily anxiety, obsessive thoughts, compulsive behavior, panic attacks  Risk Assessment: Danger to Self:  No Self-injurious Behavior: No Danger to Others: No Duty to Warn:no Physical Aggression / Violence:No  Access to Firearms a concern: No  Gang Involvement:No   Patient / guardian was educated about steps to take if suicide or homicide risk level increases between visits. While future psychiatric events cannot be accurately predicted, the patient does not currently require acute inpatient psychiatric care and does not currently meet [facility] involuntary commitment criteria.   Subjective:  Patient arrived on time for today session. She shared progress and recent events which consists primarily of her going to school. She said she was able to attend some classes face to face while the majority are online. Continues to cope with anxiety and specific situations while driving. Continues to maintain some progress related to her obsessive compulsive block checking at home. Continue to explore with patient these behaviors and identifying steps to take to cope. She plans to challenge herself leaving session today by driving back home and not pulling over to check her gas cap. We continue to identify with patient  callming, rational self talk to utilize. Family relationships were assessed; her mother continues to provide some mild pressure on her to possibly attend seminary. Patient continues to maintain differences and what her mother wants for her versus what she wants for herself.   Interventions: CBT, Solution Focused and Strength-based  Diagnosis:  No diagnosis found.   Plan: Patient is to use CBT and coping skills to help manage her daily anxiety and obsessive compulsive behaviors.  Patient to continue to work on decreasing her compulsive behaviors, utilizing cognitive and behavioral skills.   long-term goal:   Reduce overall level, frequency, and intensity of the feelings of anxiety and obsessive-compulsive behaviors so that daily functioning is not impaired.  Short-term goal:  Verbalize an understanding of how thoughts, physical feelings, and behavioral actions contribute to anxiety and its treatment. Verbalize an understanding of the role that fearful thinking plays in creating fears, excessive worry, and anxiety / panic.  Assessment of progress:  progressing .  Waldron Session, [facility]

## 2019-02-28 ENCOUNTER — Ambulatory Visit: Payer: 59 | Admitting: Mental Health

## 2019-03-06 ENCOUNTER — Ambulatory Visit (INDEPENDENT_AMBULATORY_CARE_PROVIDER_SITE_OTHER): Payer: 59 | Admitting: Mental Health

## 2019-03-06 ENCOUNTER — Other Ambulatory Visit: Payer: Self-pay

## 2019-03-06 DIAGNOSIS — F900 Attention-deficit hyperactivity disorder, predominantly inattentive type: Secondary | ICD-10-CM | POA: Diagnosis not present

## 2019-03-06 DIAGNOSIS — F429 Obsessive-compulsive disorder, unspecified: Secondary | ICD-10-CM

## 2019-03-06 NOTE — Progress Notes (Signed)
     [facility] Counselor/Therapist Progress Note   Patient ID: Michaela Pena, MRN: [id number]  Date: 03/05/19  Timespent:  58 minutes   Treatment Type: Individual   Mental Status Exam:   Appearance:   casual  Behavior:  wnl  Motor:  wnl  Speech/Language:   Clear and Coherent  Affect:  euthymic  Mood:  anxious, congruent  Thought process:  Coherent and Relevant, goal directed  Thought content:    WDL, no SI/HI  Perceptual disturbances:    none  Orientation:  Full (Time, Place, and Person)  Attention:  Good  Concentration:  good  Memory:  WNL  Fund of knowledge:   Good  Insight:    Good  Judgment:   Good  Impulse Control:  developing   Reported Symptoms: Daily anxiety, obsessive thoughts, compulsive behavior, panic attacks  Risk Assessment: Danger to Self:  No Self-injurious Behavior: No Danger to Others: No Duty to Warn:no Physical Aggression / Violence:No  Access to Firearms a concern: No  Gang Involvement:No   Patient / guardian was educated about steps to take if suicide or homicide risk level increases between visits. While future psychiatric events cannot be accurately predicted, the patient does not currently require acute inpatient psychiatric care and does not currently meet [facility] involuntary commitment criteria.   Subjective:  Patient arrived on time for today's session.  Discussed progress.  She stated that she is doing well with this academic semester thus far, plans to increase some organization in some areas and to begin using her daily calendar again to increase consistency.  She went on to share family relationships, specifically with her mother, continues to encourage patient to consider seminary to refocus her on their faith and marrying soon.  Patient continues to not want this for her future at this time in some ways to communicate this need to her mother were explored.  She shared how she has made some progress with some of her  obsessive-compulsive behaviors specifically related to driving and not checking her gas tank as often.  She reports taking her medication consistently for the last few months and feel this is playing a component in this progress.  Encouraged patient to continue to challenge herself during these situations as these are ways to take steps in decreasing the cycle.  Interventions: CBT, Solution Focused and Strength-based  Diagnosis:    ICD-10-CM   1. Obsessive-compulsive disorder, unspecified type  F42.9   2. Attention deficit hyperactivity disorder (ADHD), predominantly inattentive type  F90.0      Plan: Patient is to use CBT and coping skills to help manage her daily anxiety and obsessive compulsive behaviors.  Patient to continue to work on decreasing her compulsive behaviors, utilizing cognitive and behavioral skills.   long-term goal:   Reduce overall level, frequency, and intensity of the feelings of anxiety and obsessive-compulsive behaviors so that daily functioning is not impaired.  Short-term goal:  Verbalize an understanding of how thoughts, physical feelings, and behavioral actions contribute to anxiety and its treatment. Verbalize an understanding of the role that fearful thinking plays in creating fears, excessive worry, and anxiety / panic.  Assessment of progress:  progressing .  Waldron Session, [facility]

## 2019-03-13 ENCOUNTER — Ambulatory Visit: Payer: 59 | Admitting: Mental Health

## 2019-03-16 ENCOUNTER — Other Ambulatory Visit: Payer: Self-pay | Admitting: Psychiatry

## 2019-03-16 DIAGNOSIS — F3342 Major depressive disorder, recurrent, in full remission: Secondary | ICD-10-CM

## 2019-03-16 DIAGNOSIS — F411 Generalized anxiety disorder: Secondary | ICD-10-CM

## 2019-03-20 ENCOUNTER — Ambulatory Visit (INDEPENDENT_AMBULATORY_CARE_PROVIDER_SITE_OTHER): Payer: 59 | Admitting: Mental Health

## 2019-03-20 ENCOUNTER — Other Ambulatory Visit: Payer: Self-pay

## 2019-03-20 DIAGNOSIS — F429 Obsessive-compulsive disorder, unspecified: Secondary | ICD-10-CM

## 2019-03-20 NOTE — Progress Notes (Signed)
     [facility] Counselor/Therapist Progress Note   Patient ID: Michaela Pena, MRN: [id number]  Date: 03/20/19  Timespent:  55 minutes   Treatment Type: Individual   Mental Status Exam:   Appearance:   casual  Behavior:  wnl  Motor:  wnl  Speech/Language:   Clear and Coherent  Affect:  euthymic  Mood:  anxious, congruent  Thought process:  Coherent and Relevant, goal directed  Thought content:    WDL, no SI/HI  Perceptual disturbances:    none  Orientation:  Full (Time, Place, and Person)  Attention:  Good  Concentration:  good  Memory:  WNL  Fund of knowledge:   Good  Insight:    Good  Judgment:   Good  Impulse Control:  developing   Reported Symptoms: Daily anxiety, obsessive thoughts, compulsive behavior, panic attacks  Risk Assessment: Danger to Self:  No Self-injurious Behavior: No Danger to Others: No Duty to Warn:no Physical Aggression / Violence:No  Access to Firearms a concern: No  Gang Involvement:No   Patient / guardian was educated about steps to take if suicide or homicide risk level increases between visits. While future psychiatric events cannot be accurately predicted, the patient does not currently require acute inpatient psychiatric care and does not currently meet [facility] involuntary commitment criteria.   Subjective:  Patient presents for session in no distress, sharing recent progress and events.  She stated that she continues to go to school, stress is low the semester and she is performing well academically living towards graduation.  She continues to reside at home with her mother, sisters and a couple who are friends. She went on to share family relationships with a focus on her and her mother.  She stated they have gotten along well recently some pressure from time to time felt by her mother related to religious issues.  She shared how she has  made some continued progress in some of her compulsive behaviors such as pulling over to  check her gas Which has diminished greatly.  She disclosed other behaviors she categorized as "superstitious" such as counting a defined amount of seconds prior to starting her car.  She shared history related to how some of these behaviors began related to family and religious faith but now how they have continued relating more to her anxiety.  We reviewed some behavioral strategies to utilize to take steps toward extinguishing the behaviors as she shared she would like to change if possible.  She plans to follow through between sessions.   Interventions: CBT, Solution Focused and Strength-based  Diagnosis:    ICD-10-CM   1. Obsessive-compulsive disorder, unspecified type  F42.9      Plan: Patient is to use CBT and coping skills to help manage her daily anxiety and obsessive compulsive behaviors.  Patient to continue to work on decreasing her compulsive behaviors, utilizing cognitive and behavioral skills.   long-term goal:   Reduce overall level, frequency, and intensity of the feelings of anxiety and obsessive-compulsive behaviors so that daily functioning is not impaired.  Short-term goal:  Verbalize an understanding of how thoughts, physical feelings, and behavioral actions contribute to anxiety and its treatment. Verbalize an understanding of the role that fearful thinking plays in creating fears, excessive worry, and anxiety / panic.  Assessment of progress:  progressing .  Waldron Session, [facility]

## 2019-03-27 ENCOUNTER — Telehealth: Payer: Self-pay | Admitting: Hematology and Oncology

## 2019-03-27 NOTE — Telephone Encounter (Signed)
Per sch msg, patient wants to cancel 3/22 appts. Called and spoke with patient, confirmed cancellation. Does not wish to reschedule.

## 2019-04-03 ENCOUNTER — Inpatient Hospital Stay: Payer: 59

## 2019-04-03 ENCOUNTER — Ambulatory Visit (INDEPENDENT_AMBULATORY_CARE_PROVIDER_SITE_OTHER): Payer: 59 | Admitting: Mental Health

## 2019-04-03 ENCOUNTER — Inpatient Hospital Stay: Payer: 59 | Admitting: Hematology and Oncology

## 2019-04-03 ENCOUNTER — Other Ambulatory Visit: Payer: Self-pay

## 2019-04-03 DIAGNOSIS — F429 Obsessive-compulsive disorder, unspecified: Secondary | ICD-10-CM

## 2019-04-03 NOTE — Progress Notes (Signed)
     [facility] Counselor/Therapist Progress Note   Patient ID: Michaela Pena, MRN: [id number]  Date: 04/03/19  Timespent:  56 minutes   Treatment Type: Individual   Mental Status Exam:   Appearance:   casual  Behavior:  wnl  Motor:  wnl  Speech/Language:   Clear and Coherent  Affect:  euthymic  Mood:  anxious, congruent  Thought process:  Coherent and Relevant, goal directed  Thought content:    WDL, no SI/HI  Perceptual disturbances:    none  Orientation:  Full (Time, Place, and Person)  Attention:  Good  Concentration:  good  Memory:  WNL  Fund of knowledge:   Good  Insight:    Good  Judgment:   Good  Impulse Control:  developing   Reported Symptoms: Daily anxiety, obsessive thoughts, compulsive behavior, panic attacks  Risk Assessment: Danger to Self:  No Self-injurious Behavior: No Danger to Others: No Duty to Warn:no Physical Aggression / Violence:No  Access to Firearms a concern: No  Gang Involvement:No   Patient / guardian was educated about steps to take if suicide or homicide risk level increases between visits. While future psychiatric events cannot be accurately predicted, the patient does not currently require acute inpatient psychiatric care and does not currently meet [facility] involuntary commitment criteria.   Subjective:  Patient presents for session in no distress, sharing recent progress and events. She plans to go to [place] to see family. Its Passover and also her cousin is pregnant which are 2 of the main reason she is going to visit family.  She went on to discuss family expectations, primarily her grandmother who continues to encourage her to participate in the steps toward her marrying.  She shared ways that she plans to communicate with a focus on avoiding any stronger disagreements.  She plans to participate as she feels that she will have some meetings lined up by her grandmother.  She identified feelings through discovery  indicating that she has a connection to her faith which struggles her to participate in the process at times while most of the time wanting to avoid it.  Encouraged her to give her self the time needed, providing support and understanding and she shared expectations over the coming visit which will last 2 weeks.  She continues to report anxiety, predicts that her sisters will mildly antagonize her with having to have them take frequent stops on the trip [place].  We discussed and reviewed some calming self talk with which she can engage.   Interventions: CBT, Solution Focused and Strength-based  Diagnosis:    ICD-10-CM   1. Obsessive-compulsive disorder, unspecified type  F42.9      Plan: Patient is to use CBT and coping skills to help manage her daily anxiety and obsessive compulsive behaviors.  Patient to continue to work on decreasing her compulsive behaviors, utilizing cognitive and behavioral skills.   long-term goal:   Reduce overall level, frequency, and intensity of the feelings of anxiety and obsessive-compulsive behaviors so that daily functioning is not impaired.  Short-term goal:  Verbalize an understanding of how thoughts, physical feelings, and behavioral actions contribute to anxiety and its treatment. Verbalize an understanding of the role that fearful thinking plays in creating fears, excessive worry, and anxiety / panic.  Assessment of progress:  progressing .  Waldron Session, [facility]

## 2019-04-17 ENCOUNTER — Other Ambulatory Visit: Payer: Self-pay

## 2019-04-17 ENCOUNTER — Ambulatory Visit (INDEPENDENT_AMBULATORY_CARE_PROVIDER_SITE_OTHER): Payer: 59 | Admitting: Mental Health

## 2019-04-17 DIAGNOSIS — F429 Obsessive-compulsive disorder, unspecified: Secondary | ICD-10-CM

## 2019-04-18 NOTE — Progress Notes (Signed)
     [facility] Counselor/Therapist Progress Note   Patient ID: Michaela Pena, MRN: [id number]  Date: 04/17/19  Timespent:  54 minutes   Treatment Type: Individual   Mental Status Exam:   Appearance:   casual  Behavior:  wnl  Motor:  wnl  Speech/Language:   Clear and Coherent  Affect:  euthymic  Mood:  anxious, congruent  Thought process:  Coherent and Relevant, goal directed  Thought content:    WDL, no SI/HI  Perceptual disturbances:    none  Orientation:  Full (Time, Place, and Person)  Attention:  Good  Concentration:  good  Memory:  WNL  Fund of knowledge:   Good  Insight:    Good  Judgment:   Good  Impulse Control:  developing   Reported Symptoms: Daily anxiety, obsessive thoughts, compulsive behavior, panic attacks  Risk Assessment: Danger to Self:  No Self-injurious Behavior: No Danger to Others: No Duty to Warn:no Physical Aggression / Violence:No  Access to Firearms a concern: No  Gang Involvement:No   Patient / guardian was educated about steps to take if suicide or homicide risk level increases between visits. While future psychiatric events cannot be accurately predicted, the patient does not currently require acute inpatient psychiatric care and does not currently meet [facility] involuntary commitment criteria.   Subjective:  Patient shared changes since her last session and recent events.  She stated she went with her sisters to [place] to visit family with a focus on celebrating Passover.  She shared how her aunt had set her up with a young man with whom she says she had 3 meetings.  She said per their courtship process the fourth meeting/interaction would be potentially for proposal for marriage.  She expressed not wanting to go forward with the process citing her own personal beliefs.  She shared how her mother continues to encourage her to engage in this process however, she plans to set some boundaries in this relationship as she has  before in this area.  She continues to struggle at times with checking behaviors while driving.  She expressed motivation and intent to pick up where she left off prior to her leaving where she is making progress in this area specifically not checking her gas cap often when driving.  We reviewed cognitive strategies to utilize.   Interventions: CBT, Solution Focused and Strength-based  Diagnosis:    ICD-10-CM   1. Obsessive-compulsive disorder, unspecified type  F42.9      Plan: Patient is to use CBT and coping skills to help manage her daily anxiety and obsessive compulsive behaviors.  Patient to continue to work on decreasing her compulsive behaviors, utilizing cognitive and behavioral skills.   long-term goal:   Reduce overall level, frequency, and intensity of the feelings of anxiety and obsessive-compulsive behaviors so that daily functioning is not impaired.  Short-term goal:  Verbalize an understanding of how thoughts, physical feelings, and behavioral actions contribute to anxiety and its treatment. Verbalize an understanding of the role that fearful thinking plays in creating fears, excessive worry, and anxiety / panic.  Assessment of progress:  progressing .  Waldron Session, [facility]

## 2019-04-24 ENCOUNTER — Ambulatory Visit: Payer: 59 | Admitting: Mental Health

## 2019-04-25 ENCOUNTER — Other Ambulatory Visit: Payer: Self-pay

## 2019-04-25 ENCOUNTER — Encounter: Payer: Self-pay | Admitting: Psychiatry

## 2019-04-25 ENCOUNTER — Ambulatory Visit (INDEPENDENT_AMBULATORY_CARE_PROVIDER_SITE_OTHER): Payer: 59 | Admitting: Psychiatry

## 2019-04-25 VITALS — BP 102/74 | HR 109 | Wt 148.0 lb

## 2019-04-25 DIAGNOSIS — F411 Generalized anxiety disorder: Secondary | ICD-10-CM | POA: Diagnosis not present

## 2019-04-25 DIAGNOSIS — F429 Obsessive-compulsive disorder, unspecified: Secondary | ICD-10-CM

## 2019-04-25 DIAGNOSIS — F900 Attention-deficit hyperactivity disorder, predominantly inattentive type: Secondary | ICD-10-CM

## 2019-04-25 DIAGNOSIS — F3342 Major depressive disorder, recurrent, in full remission: Secondary | ICD-10-CM

## 2019-04-25 MED ORDER — AMPHETAMINE SULFATE 10 MG PO TABS: 10.0000 mg | ORAL_TABLET | Freq: Every morning | ORAL | 0 refills | Status: DC

## 2019-04-25 MED ORDER — BUPROPION HCL ER (XL) 150 MG PO TB24: 150.0000 mg | ORAL_TABLET | ORAL | 1 refills | Status: DC

## 2019-04-25 NOTE — Progress Notes (Signed)
Michaela Pena [id number] 1995-06-23 24 y.o.  Subjective:   Patient ID:  Michaela Pena is a 24 y.o. (DOB Jan 30, 1995) female.  Chief Complaint:  Chief Complaint  Patient presents with  . Follow-up    ADD,  Anxiety, and Depression    HPI Marguetta Windish presents to the office today for follow-up of anxiety, depression, and ADD. She reports that her mood has been "fine." Denies depressed mood. Anxiety has been "fine." She reports that she has had some obsessive thoughts that are consistent with baseline that have been manageable. Denies compulsions or panic. Denies excessive worry. She reports adequate sleep. Appetite has been stable. Reports that she has been consistently losing weight over 6 months. Concentration has been good. Denies SI.   Has been taking classes. 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 Has been working some part-time. Denies any change in activity or exercise. Walks about 2 miles a day.    Past Psychiatric Medication Trials: Wellbutrin XL Sertraline Adderall XR- Effective, however unable to tolerate due to n/v Adderall immediate release- side effects    Review of Systems:  Review of Systems  Cardiovascular: Negative for palpitations.  Musculoskeletal: Negative for gait problem.  Neurological: Negative for tremors.  Hematological: Does not bruise/bleed easily.  Psychiatric/Behavioral:       Please refer to HPI   Reports that she has had long-term increased HR. Medications: I have reviewed the patient's current medications.  Current Outpatient Medications  Medication Sig Dispense Refill  . [START ON 07/11/2019] Amphetamine Sulfate (EVEKEO) 10 MG TABS Take 10 mg by mouth every morning. 30 tablet 0  . [START ON 06/13/2019] Amphetamine Sulfate (EVEKEO) 10 MG TABS Take 10 mg by mouth every morning. 30 tablet 0  . [START ON 05/16/2019] Amphetamine Sulfate (EVEKEO) 10 MG TABS Take 10 mg by mouth every morning. 30 tablet 0  . buPROPion (WELLBUTRIN XL)  150 MG 24 hr tablet Take 1 tablet (150 mg total) by mouth every morning. 90 tablet 1  . levothyroxine (SYNTHROID) 25 MCG tablet Take 25 mcg by mouth daily before breakfast.    . sertraline (ZOLOFT) 100 MG tablet TAKE 2 TABLETS EVERY DAY 180 tablet 1   No current facility-administered medications for this visit.    Medication Side Effects: None  Allergies: No Known Allergies  Past Medical History:  Diagnosis Date  . ADD (attention deficit disorder)   . Anxiety   . Thyroid disease     Family History  Problem Relation Age of Onset  . Anxiety disorder Mother   . Depression Mother   . Hypothyroidism Mother   . Anxiety disorder Maternal Grandmother   . Depression Maternal Grandmother   . Polycystic kidney disease Paternal Grandfather     Social History   Socioeconomic History  . Marital status: Single    Spouse name: Not on file  . Number of children: Not on file  . Years of education: Not on file  . Highest education level: Not on file  Occupational History  . Occupation: History Major at [employer]  Tobacco Use  . Smoking status: Never Smoker  . Smokeless tobacco: Never Used  Substance and Sexual Activity  . Alcohol use: Never  . Drug use: Not on file  . Sexual activity: Not on file  Other Topics Concern  . Not on file  Social History Narrative  . Not on file   Social Determinants of Health   Financial Resource Strain:   . Difficulty of Paying Living Expenses:   Food  Insecurity:   . Worried About Programme researcher, broadcasting/film/video in the Last Year:   . Barista in the Last Year:   Transportation Needs:   . Freight forwarder (Medical):   Marland Kitchen Lack of Transportation (Non-Medical):   Physical Activity:   . Days of Exercise per Week:   . Minutes of Exercise per Session:   Stress:   . Feeling of Stress :   Social Connections:   . Frequency of Communication with Friends and Family:   . Frequency of Social Gatherings with Friends and Family:   . Attends Religious  Services:   . Active Member of Clubs or Organizations:   . Attends Banker Meetings:   Marland Kitchen Marital Status:   Intimate Partner Violence:   . Fear of Current or Ex-Partner:   . Emotionally Abused:   Marland Kitchen Physically Abused:   . Sexually Abused:     Past Medical History, Surgical history, Social history, and Family history were reviewed and updated as appropriate.   Please see review of systems for further details on the patient's review from today.   Objective:   Physical Exam:  BP 102/74   Pulse (!) 109   Wt 148 lb (67.1 kg)   BMI 21.24 kg/m   Physical Exam Constitutional:      General: She is not in acute distress. Musculoskeletal:        General: No deformity.  Neurological:     Mental Status: She is alert and oriented to person, place, and time.     Coordination: Coordination normal.  Psychiatric:        Attention and Perception: Attention and perception normal. She does not perceive auditory or visual hallucinations.        Mood and Affect: Mood normal. Mood is not anxious or depressed. Affect is not labile, blunt, angry or inappropriate.        Speech: Speech normal.        Behavior: Behavior normal.        Thought Content: Thought content normal. Thought content is not paranoid or delusional. Thought content does not include homicidal or suicidal ideation. Thought content does not include homicidal or suicidal plan.        Cognition and Memory: Cognition and memory normal.        Judgment: Judgment normal.     Comments: Insight intact     Lab Review:     Component Value Date/Time   NA 141 01/03/2019 1125   K 4.5 01/03/2019 1125   CL 107 01/03/2019 1125   CO2 25 01/03/2019 1125   GLUCOSE 94 01/03/2019 1125   BUN 13 01/03/2019 1125   CREATININE 0.80 01/03/2019 1125   CALCIUM 9.2 01/03/2019 1125   PROT 7.2 01/03/2019 1125   ALBUMIN 4.3 01/03/2019 1125   AST 18 01/03/2019 1125   ALT 13 01/03/2019 1125   ALKPHOS 96 01/03/2019 1125   BILITOT 0.4  01/03/2019 1125   GFRNONAA >60 01/03/2019 1125   GFRAA >60 01/03/2019 1125       Component Value Date/Time   WBC 6.8 01/03/2019 1125   RBC 4.68 01/03/2019 1125   HGB 14.0 01/03/2019 1125   HCT 42.5 01/03/2019 1125   PLT 256 01/03/2019 1125   MCV 90.8 01/03/2019 1125   MCH 29.9 01/03/2019 1125   MCHC 32.9 01/03/2019 1125   RDW 12.4 01/03/2019 1125   LYMPHSABS 1.9 01/03/2019 1125   MONOABS 0.3 01/03/2019 1125   EOSABS 0.1 01/03/2019 1125  BASOSABS 0.0 01/03/2019 1125    No results found for: POCLITH, LITHIUM   No results found for: PHENYTOIN, PHENOBARB, VALPROATE, CBMZ   .res Assessment: Plan:   Patient reports having an elevated heart rate at times, regardless of whether she has taken Evekeo.  Encouraged patient to monitor heart rate and to notify our office if her heart rate is staying consistently above 100 bpm at rest.  Discussed how she can monitor her heart rate at home.  Encouraged patient to consider follow-up with PCP if she continues to experience elevated heart rate. Continue Evekeo10 mg daily for ADHD. Continue Wellbutrin XL 150 mg daily for depression. Continue sertraline 200 mg daily for anxiety and depression. Patient to follow-up in 3 months or sooner if clinically indicated. Patient advised to contact office with any questions, adverse effects, or acute worsening in signs and symptoms.    Eusebia was seen today for follow-up.  Diagnoses and all orders for this visit:  Attention deficit hyperactivity disorder (ADHD), predominantly inattentive type -     Amphetamine Sulfate (EVEKEO) 10 MG TABS; Take 10 mg by mouth every morning. -     Amphetamine Sulfate (EVEKEO) 10 MG TABS; Take 10 mg by mouth every morning. -     Amphetamine Sulfate (EVEKEO) 10 MG TABS; Take 10 mg by mouth every morning.  Recurrent major depressive disorder, in full remission (HCC) -     buPROPion (WELLBUTRIN XL) 150 MG 24 hr tablet; Take 1 tablet (150 mg total) by mouth every  morning.  Generalized anxiety disorder  Obsessive-compulsive disorder, unspecified type     Please see After Visit Summary for patient specific instructions.  Future Appointments  Date Time Provider Department Center  05/01/2019 11:00 AM Waldron Session, [facility] CP-CP None  05/15/2019 11:00 AM Waldron Session, [facility] CP-CP None  05/29/2019 11:00 AM Waldron Session, [facility] CP-CP None  07/25/2019  9:00 AM Corie Chiquito, PMHNP CP-CP None    No orders of the defined types were placed in this encounter.   -------------------------------

## 2019-05-01 ENCOUNTER — Ambulatory Visit (INDEPENDENT_AMBULATORY_CARE_PROVIDER_SITE_OTHER): Payer: 59 | Admitting: Mental Health

## 2019-05-01 ENCOUNTER — Other Ambulatory Visit: Payer: Self-pay

## 2019-05-01 DIAGNOSIS — F429 Obsessive-compulsive disorder, unspecified: Secondary | ICD-10-CM

## 2019-05-01 NOTE — Progress Notes (Signed)
[facility] Counselor/Therapist Progress Note   Patient ID: Jada Fass, MRN: [id number]  Date: 05/01/19  Timespent:  53 minutes   Treatment Type: Individual   Mental Status Exam:   Appearance:   casual  Behavior:  wnl  Motor:  wnl  Speech/Language:   Clear and Coherent  Affect:   Full range  Mood:  anxious, pleasant  Thought process:  Coherent and Relevant, goal directed  Thought content:    WDL, no SI/HI  Perceptual disturbances:    none  Orientation:  Full (Time, Place, and Person)  Attention:  Good  Concentration:  good  Memory:  WNL  Fund of knowledge:   Good  Insight:    Good  Judgment:   Good  Impulse Control:  developing   Reported Symptoms: Daily anxiety, obsessive thoughts, compulsive behavior, panic attacks  Risk Assessment: Danger to Self:  No Self-injurious Behavior: No Danger to Others: No Duty to Warn:no Physical Aggression / Violence:No  Access to Firearms a concern: No  Gang Involvement:No   Patient / guardian was educated about steps to take if suicide or homicide risk level increases between visits. While future psychiatric events cannot be accurately predicted, the patient does not currently require acute inpatient psychiatric care and does not currently meet [facility] involuntary commitment criteria.   Subjective:  Patient shared progress, how she learned prior to the session that young man she met on her visit to [place] is coming to visit her him next weekend.  She shared how this is related to her religious faith and how family has arranged him to visit.  She does not want him to visit however, she stated this is not something in her control.  She shared how her aunt has been pressuring her to get married sharing some content from a recent phone call.  She stated her mother has also been pressuring her at times.  Patient continues to verbalize how she does not want to get married at this point in her life but how the significant  family pressure has been challenging to deal with.  She stated that if she does not marry she will be shunned by some of the members of her family and extended family.  Some ways to communicate her feelings and needs in her closer relationship such as with that of her mother was explored.  Her plan at this point is to not discussed the issue as she is attempted this with her mother multiple times in the past.  She did identify being more direct if she was asked to go back to [place] which would probably involve his proposing marriage to her.  She continues to make progress with some of her obsessive-compulsive behaviors, stated that she is not checking her gas As often but recently has engaged in some increase and checking locks at night.  She feels this may be attributed to just increased stress in her life.  We reviewed some coping skills discussed in previous sessions.   Interventions: CBT, Solution Focused and Strength-based  Diagnosis:    ICD-10-CM   1. Obsessive-compulsive disorder, unspecified type  F42.9      Plan: Patient is to use CBT and coping skills to help manage her daily anxiety and obsessive compulsive behaviors.  Patient to continue to work on decreasing her compulsive behaviors, utilizing cognitive and behavioral skills.   long-term goal:   Reduce overall level, frequency, and intensity of the feelings of anxiety and obsessive-compulsive behaviors so  that daily functioning is not impaired.  Short-term goal:  Verbalize an understanding of how thoughts, physical feelings, and behavioral actions contribute to anxiety and its treatment. Verbalize an understanding of the role that fearful thinking plays in creating fears, excessive worry, and anxiety / panic.  Assessment of progress:  progressing .  Anson [place], [facility]

## 2019-05-12 ENCOUNTER — Inpatient Hospital Stay: Payer: 59

## 2019-05-12 ENCOUNTER — Other Ambulatory Visit: Payer: Self-pay

## 2019-05-12 ENCOUNTER — Inpatient Hospital Stay: Payer: 59 | Attending: Hematology and Oncology | Admitting: Hematology and Oncology

## 2019-05-12 ENCOUNTER — Encounter: Payer: Self-pay | Admitting: Hematology and Oncology

## 2019-05-12 ENCOUNTER — Other Ambulatory Visit: Payer: Self-pay | Admitting: Hematology and Oncology

## 2019-05-12 VITALS — BP 111/81 | HR 102 | Temp 98.3°F | Resp 17 | Ht 70.0 in | Wt 146.0 lb

## 2019-05-12 DIAGNOSIS — T148XXA Other injury of unspecified body region, initial encounter: Secondary | ICD-10-CM

## 2019-05-12 DIAGNOSIS — E079 Disorder of thyroid, unspecified: Secondary | ICD-10-CM | POA: Diagnosis not present

## 2019-05-12 DIAGNOSIS — R634 Abnormal weight loss: Secondary | ICD-10-CM | POA: Insufficient documentation

## 2019-05-12 DIAGNOSIS — R238 Other skin changes: Secondary | ICD-10-CM | POA: Insufficient documentation

## 2019-05-12 LAB — CBC WITH DIFFERENTIAL (CANCER CENTER ONLY)
Abs Immature Granulocytes: 0.02 10*3/uL (ref 0.00–0.07)
Basophils Absolute: 0 10*3/uL (ref 0.0–0.1)
Basophils Relative: 1 %
Eosinophils Absolute: 0.1 10*3/uL (ref 0.0–0.5)
Eosinophils Relative: 1 %
HCT: 40.8 % (ref 36.0–46.0)
Hemoglobin: 13.3 g/dL (ref 12.0–15.0)
Immature Granulocytes: 0 %
Lymphocytes Relative: 30 %
Lymphs Abs: 1.8 10*3/uL (ref 0.7–4.0)
MCH: 29.7 pg (ref 26.0–34.0)
MCHC: 32.6 g/dL (ref 30.0–36.0)
MCV: 91.1 fL (ref 80.0–100.0)
Monocytes Absolute: 0.4 10*3/uL (ref 0.1–1.0)
Monocytes Relative: 6 %
Neutro Abs: 3.6 10*3/uL (ref 1.7–7.7)
Neutrophils Relative %: 62 %
Platelet Count: 232 10*3/uL (ref 150–400)
RBC: 4.48 MIL/uL (ref 3.87–5.11)
RDW: 12.1 % (ref 11.5–15.5)
WBC Count: 5.8 10*3/uL (ref 4.0–10.5)
nRBC: 0 % (ref 0.0–0.2)

## 2019-05-12 LAB — CMP (CANCER CENTER ONLY)
ALT: 11 U/L (ref 0–44)
AST: 15 U/L (ref 15–41)
Albumin: 4.1 g/dL (ref 3.5–5.0)
Alkaline Phosphatase: 85 U/L (ref 38–126)
Anion gap: 7 (ref 5–15)
BUN: 12 mg/dL (ref 6–20)
CO2: 27 mmol/L (ref 22–32)
Calcium: 9.1 mg/dL (ref 8.9–10.3)
Chloride: 107 mmol/L (ref 98–111)
Creatinine: 0.82 mg/dL (ref 0.44–1.00)
GFR, Est AFR Am: >60 mL/min (ref >60)
GFR, Estimated: >60 mL/min (ref >60)
Glucose, Bld: 94 mg/dL (ref 70–99)
Potassium: 4.2 mmol/L (ref 3.5–5.1)
Sodium: 141 mmol/L (ref 135–145)
Total Bilirubin: 0.4 mg/dL (ref 0.3–1.2)
Total Protein: 6.9 g/dL (ref 6.5–8.1)

## 2019-05-12 LAB — LACTATE DEHYDROGENASE: LDH: 167 U/L (ref 98–192)

## 2019-05-12 NOTE — Progress Notes (Signed)
[facility] [phone]   [phone]  PROGRESS NOTE  Patient Care Team: [facility] [place] as PCP - General (Family Medicine)  Hematological/Oncological History # Easy Bruising 1) 01/03/2019: establish care with Dr. Leonides Schanz. Notes 6 months of spontaneous bruising. Bruising workup showed normal PT, PTT, INR, and Vitamin C  #Enlarge Lymph node of the neck 1) 01/03/2019: small palpable lymph node in right cervical chain, just under the jaw.  2) 05/12/2019: lymph node resolved on exam today  #Weight Loss 1) 01/03/2019: reported 20-25 lbs of weight loss prior to last visit. Weight 149 lbs 2) 05/12/2019: weight 146 lbs. Stable from prior.   Interval History:  Michaela Pena 24 y.o. female with medical history significant for ADD and thyroid disease who presents for a follow up visit. The patient's last visit was on 01/03/2019. In the interim since the last visit she has had no major changes in medication.   On exam today Mrs. Michaela Pena notes she has been well in the interim since her last visit. Her weight has begun to stabilize without any particular intervention.  Per our records she is down 3 pounds from her visit in December 2020.,  However she is currently equal weight to a measurement that was obtained on 01/25/2019.  The patient has not had any changes in the dosage of her ADD medication or her levothyroxine in the interim.  She also notes that she has not changed her diet or increased caloric intake to combat her weight loss.  Fortunately in the interim she has had a decrease in size of the lymph node her neck it is now not palpable at all.  She has a marked increase in bruising with some bruising on her left arm, right leg, and left leg.  She also has some scratches on her right leg from when she was crawling under the porch attempting to catch a kitten.  She notes she has not had an increase in physical activity or contact  sports resulting in the bruising that we are seeing on physical exam.  This is also increased from the prior bruising during her last visit.  She also denies having any other overt signs of bleeding with no nosebleeds, heavy menstrual bleeding, bright red blood per rectum, or dark stools.  Overall she notes she is at her baseline level of health with no changes in energy.  A full 10 point ROS is listed below.  MEDICAL HISTORY:  Past Medical History:  Diagnosis Date  . ADD (attention deficit disorder)   . Anxiety   . Thyroid disease     SURGICAL HISTORY: Past Surgical History:  Procedure Laterality Date  . WISDOM TOOTH EXTRACTION      SOCIAL HISTORY: Social History   Socioeconomic History  . Marital status: Single    Spouse name: Not on file  . Number of children: Not on file  . Years of education: Not on file  . Highest education level: Not on file  Occupational History  . Occupation: History Major at [employer]  Tobacco Use  . Smoking status: Never Smoker  . Smokeless tobacco: Never Used  Substance and Sexual Activity  . Alcohol use: Never  . Drug use: Not on file  . Sexual activity: Not on file  Other Topics Concern  . Not on file  Social History Narrative  . Not on file   Social Determinants of Health   Financial Resource Strain:   . Difficulty of Paying Living  Expenses:   Food Insecurity:   . Worried About Charity fundraiser in the Last Year:   . Arboriculturist in the Last Year:   Transportation Needs:   . Film/video editor (Medical):   Marland Kitchen Lack of Transportation (Non-Medical):   Physical Activity:   . Days of Exercise per Week:   . Minutes of Exercise per Session:   Stress:   . Feeling of Stress :   Social Connections:   . Frequency of Communication with Friends and Family:   . Frequency of Social Gatherings with Friends and Family:   . Attends Religious Services:   . Active Member of Clubs or Organizations:   . Attends Archivist Meetings:     Marland Kitchen Marital Status:   Intimate Partner Violence:   . Fear of Current or Ex-Partner:   . Emotionally Abused:   Marland Kitchen Physically Abused:   . Sexually Abused:     FAMILY HISTORY: Family History  Problem Relation Age of Onset  . Anxiety disorder Mother   . Depression Mother   . Hypothyroidism Mother   . Anxiety disorder Maternal Grandmother   . Depression Maternal Grandmother   . Polycystic kidney disease Paternal Grandfather     ALLERGIES:  has No Known Allergies.  MEDICATIONS:  Current Outpatient Medications  Medication Sig Dispense Refill  . [START ON 07/11/2019] Amphetamine Sulfate (EVEKEO) 10 MG TABS Take 10 mg by mouth every morning. 30 tablet 0  . [START ON 06/13/2019] Amphetamine Sulfate (EVEKEO) 10 MG TABS Take 10 mg by mouth every morning. 30 tablet 0  . [START ON 05/16/2019] Amphetamine Sulfate (EVEKEO) 10 MG TABS Take 10 mg by mouth every morning. 30 tablet 0  . buPROPion (WELLBUTRIN XL) 150 MG 24 hr tablet Take 1 tablet (150 mg total) by mouth every morning. 90 tablet 1  . levothyroxine (SYNTHROID) 25 MCG tablet Take 25 mcg by mouth daily before breakfast.    . sertraline (ZOLOFT) 100 MG tablet TAKE 2 TABLETS EVERY DAY 180 tablet 1   No current facility-administered medications for this visit.    REVIEW OF SYSTEMS:   Constitutional: ( - ) fevers, ( - )  chills , ( - ) night sweats Eyes: ( - ) blurriness of vision, ( - ) double vision, ( - ) watery eyes Ears, nose, mouth, throat, and face: ( - ) mucositis, ( - ) sore throat Respiratory: ( - ) cough, ( - ) dyspnea, ( - ) wheezes Cardiovascular: ( - ) palpitation, ( - ) chest discomfort, ( - ) lower extremity swelling Gastrointestinal:  ( - ) nausea, ( - ) heartburn, ( - ) change in bowel habits Skin: ( - ) abnormal skin rashes Lymphatics: ( - ) new lymphadenopathy, ( - ) easy bruising Neurological: ( - ) numbness, ( - ) tingling, ( - ) new weaknesses Behavioral/Psych: ( - ) mood change, ( - ) new changes  All other systems  were reviewed with the patient and are negative.  PHYSICAL EXAMINATION: ECOG PERFORMANCE STATUS: 0 - Asymptomatic  Vitals:   05/12/19 1129  BP: 111/81  Pulse: (!) 102  Resp: 17  Temp: 98.3 F (36.8 C)  SpO2: 100%   Filed Weights   05/12/19 1129  Weight: 146 lb (66.2 kg)    GENERAL: well appearing young Caucasian female. Alert, no distress and comfortable SKIN: bruising of the lower extremities bilaterally with bruising of the left arm. Appear at various stages of healing. Also some abrasions  on leg/arm.  EYES: conjunctiva are pink and non-injected, sclera clear LYMPH:  no palpable lymphadenopathy in the cervical, axillary or supraclavicular LUNGS: clear to auscultation and percussion with normal breathing effort HEART: regular rate & rhythm and no murmurs and no lower extremity edema Musculoskeletal: no cyanosis of digits and no clubbing  PSYCH: alert & oriented x 3, fluent speech NEURO: no focal motor/sensory deficits  LABORATORY DATA:  I have reviewed the data as listed CBC Latest Ref Rng & Units 05/12/2019 01/03/2019  WBC 4.0 - 10.5 K/uL 5.8 6.8  Hemoglobin 12.0 - 15.0 g/dL 69.6 78.9  Hematocrit 38.1 - 46.0 % 40.8 42.5  Platelets 150 - 400 K/uL 232 256    CMP Latest Ref Rng & Units 01/03/2019  Glucose 70 - 99 mg/dL 94  BUN 6 - 20 mg/dL 13  Creatinine 0.17 - 5.10 mg/dL 2.58  Sodium 527 - 782 mmol/L 141  Potassium 3.5 - 5.1 mmol/L 4.5  Chloride 98 - 111 mmol/L 107  CO2 22 - 32 mmol/L 25  Calcium 8.9 - 10.3 mg/dL 9.2  Total Protein 6.5 - 8.1 g/dL 7.2  Total Bilirubin 0.3 - 1.2 mg/dL 0.4  Alkaline Phos 38 - 126 U/L 96  AST 15 - 41 U/L 18  ALT 0 - 44 U/L 13    RADIOGRAPHIC STUDIES: None relevant to review.  No results found.  ASSESSMENT & PLAN Michaela Pena 24 y.o. female with medical history significant for ADD and thyroid disease who presents for a follow up visit.  After review the labs and discussion with the patient her findings are most  consistent with a transiently enlarged lymph node.  Fortunately in the interim her weight loss is also stabilized.  I strongly suspect that medication side effect was the blame for her episodes of weight loss.  As for her easy bruising we have found no evidence of a bleeding disorder and fortunately her bruising has been limited to the extremities which is reassuring for a more benign process.  The patient does have abrasions on her arm and leg and this implies that she may be obtaining these bruises with physical activity without realizing it.  Additionally bruising can be associated with hypothyroidism for which she is currently undergoing treatment.  At this time I do not think that there is any further work-up required in our clinic.  I would recommend that her weight and bruising be monitored by her primary care provider and that she be referred to [place] in the event that she were to develop any new or concerning abnormalities.   #Easy Bruising --prior workup showed no elevations in PT, PTT, or INR  --no other overt sources of bleeding, no heavy menstrual cycles or dark stools --bruising is a known side effect of hypothyroidism, possible this is the cause of bruising. --bruising limited to the extremities alone is reassuring for a benign cause --there is no need for routine f/u in our clinic.   #Weight Loss, stable --reportedly lost 20-25 lbs prior to our last visit. Weight has been stable over the last 3 months.  --possible side effect of her ADD medications and levothyroxine.  --no clear findings that would raise alarm for a focal issue (inflammatory, infectious, or malignant). --continue to monitor with PCP  #Enlarged Lymph Node, resolved. --prior modestly enlarged anterior cervical chain lymph node, decreasing in size and present over the last 2 weeks --resolved in the interim. --possibly secondary to a subclinical infection or other transient inflammatory stimulus. --monitor for  recurrence or other lymphadenopathy   No orders of the defined types were placed in this encounter.   All questions were answered. The patient knows to call the clinic with any problems, questions or concerns.  A total of more than 20 minutes were spent on this encounter and over half of that time was spent on counseling and coordination of care as outlined above.   Ulysees Barns, MD Department of Hematology/Oncology [facility] Phone: [phone] Pager: [phone] Email: [email or web address]  05/12/2019 12:05 PM

## 2019-05-15 ENCOUNTER — Telehealth: Payer: Self-pay | Admitting: *Deleted

## 2019-05-15 ENCOUNTER — Ambulatory Visit (INDEPENDENT_AMBULATORY_CARE_PROVIDER_SITE_OTHER): Payer: 59 | Admitting: Mental Health

## 2019-05-15 ENCOUNTER — Other Ambulatory Visit: Payer: Self-pay

## 2019-05-15 DIAGNOSIS — F411 Generalized anxiety disorder: Secondary | ICD-10-CM | POA: Diagnosis not present

## 2019-05-15 DIAGNOSIS — F429 Obsessive-compulsive disorder, unspecified: Secondary | ICD-10-CM

## 2019-05-15 NOTE — Telephone Encounter (Signed)
TCT patient regarding her lab results from 05/12/19.  Spoke with her and advised that all  her results came back normal.  Pt does not need follow up in this clinic.  Pt voiced understanding.

## 2019-05-15 NOTE — Progress Notes (Signed)
[facility] Counselor/Therapist Progress Note   Patient ID: Michaela Pena, MRN: [id number]  Date: 05/15/19  Timespent:  55 minutes   Treatment Type: Individual   Mental Status Exam:   Appearance:   casual  Behavior:  wnl  Motor:  wnl  Speech/Language:   Clear and Coherent  Affect:   Full range  Mood:  anxious, pleasant  Thought process:  Coherent and Relevant, goal directed  Thought content:    WDL, no SI/HI  Perceptual disturbances:    none  Orientation:  Full (Time, Place, and Person)  Attention:  Good  Concentration:  good  Memory:  WNL  Fund of knowledge:   Good  Insight:    Good  Judgment:   Good  Impulse Control:  developing   Reported Symptoms: Daily anxiety, obsessive thoughts, compulsive behavior, panic attacks  Risk Assessment: Danger to Self:  No Self-injurious Behavior: No Danger to Others: No Duty to Warn:no Physical Aggression / Violence:No  Access to Firearms a concern: No  Gang Involvement:No   Patient / guardian was educated about steps to take if suicide or homicide risk level increases between visits. While future psychiatric events cannot be accurately predicted, the patient does not currently require acute inpatient psychiatric care and does not currently meet [facility] involuntary commitment criteria.   Subjective:  Patient presents for session in no distress.  She shared recent experiences where her family continues to encourage her to marry.  She stated that a young man she had met briefly on her visit to [place] came over the weekend to visit them as part of the courting process through their faith.  She feels that the next step is he will propose marriage.  She stated that there is a coming religious holiday next month where she is to return to [place], and she feels that the proposal will happen if she goes at that time.  She continues to express her desire to not be married, much less date anyone at this point.  Some ways to  communicate this were explored while also assisting her in identifying and processing any feelings related to the significant life experiences.  She shared how she has been stressed but also has known for years that this time would come.  She stated she is having panic attacks about 2 times per week, continues to take her medication consistently.  We discussed the possibility of her benefiting from an additional medication and she was agreeable to our reaching out to Thayer Headings, NP. Explored coping reviewed thought stopping skill.  Interventions: CBT, Solution Focused and Strength-based  Diagnosis:    ICD-10-CM   1. Obsessive-compulsive disorder, unspecified type  F42.9   2. Generalized anxiety disorder  F41.1      Plan: Patient is to use CBT and coping skills to help manage her daily anxiety and obsessive compulsive behaviors.  Patient to continue to work on decreasing her compulsive behaviors, utilizing cognitive and behavioral skills.   long-term goal:   Reduce overall level, frequency, and intensity of the feelings of anxiety and obsessive-compulsive behaviors so that daily functioning is not impaired.  Short-term goal:  Verbalize an understanding of how thoughts, physical feelings, and behavioral actions contribute to anxiety and its treatment. Verbalize an understanding of the role that fearful thinking plays in creating fears, excessive worry, and anxiety / panic.  Assessment of progress:  progressing .  Anson Oregon, [facility]

## 2019-05-15 NOTE — Telephone Encounter (Signed)
-----   Message from Jaci Standard, MD sent at 05/15/2019  9:14 AM EDT ----- Please call Michaela Pena to let her know her bloodwork returned completely normal. Her blood counts and electrolytes are all within normal limits. We do not need routine f/u in our clinic.  Azucena Freed  ----- Message ----- From: Leory Plowman, [facility] [place] Sent: 05/12/2019  11:32 AM EDT To: Jaci Standard, MD

## 2019-05-16 ENCOUNTER — Telehealth: Payer: Self-pay | Admitting: Psychiatry

## 2019-05-16 DIAGNOSIS — F411 Generalized anxiety disorder: Secondary | ICD-10-CM

## 2019-05-16 DIAGNOSIS — F429 Obsessive-compulsive disorder, unspecified: Secondary | ICD-10-CM

## 2019-05-16 MED ORDER — ALPRAZOLAM 0.25 MG PO TABS: ORAL_TABLET | ORAL | 1 refills | Status: AC

## 2019-05-16 NOTE — Telephone Encounter (Signed)
Called pt after therapist reported that she has been having panic attacks 2-3 times a week. Pt reports that panic attacks are lasting 15-20 minutes with abrupt onset. She reports that she once took part of a Xanax years ago and this was effective. Discussed potential benefits, risk, and side effects of benzodiazepines to include potential risk of tolerance and dependence, as well as possible drowsiness.  Advised patient not to drive if experiencing drowsiness and to take lowest possible effective dose to minimize risk of dependence and tolerance.  Patient advised to contact office with any questions, adverse effects, or acute worsening in signs and symptoms.

## 2019-05-29 ENCOUNTER — Ambulatory Visit (INDEPENDENT_AMBULATORY_CARE_PROVIDER_SITE_OTHER): Payer: 59 | Admitting: Mental Health

## 2019-05-29 ENCOUNTER — Other Ambulatory Visit: Payer: Self-pay

## 2019-05-29 DIAGNOSIS — F429 Obsessive-compulsive disorder, unspecified: Secondary | ICD-10-CM | POA: Diagnosis not present

## 2019-05-29 DIAGNOSIS — F411 Generalized anxiety disorder: Secondary | ICD-10-CM

## 2019-05-29 NOTE — Progress Notes (Signed)
     [facility] Counselor/Therapist Progress Note   Patient ID: Michaela Pena, MRN: [id number]  Date: 05/29/19  Timespent:  55 minutes   Treatment Type: Individual   Mental Status Exam:   Appearance:   casual  Behavior:  wnl  Motor:  wnl  Speech/Language:   Clear and Coherent  Affect:   Full range  Mood:  anxious, pleasant  Thought process:  Coherent and Relevant, goal directed  Thought content:    WDL, no SI/HI  Perceptual disturbances:    none  Orientation:  Full (Time, Place, and Person)  Attention:  Good  Concentration:  good  Memory:  WNL  Fund of knowledge:   Good  Insight:    Good  Judgment:   Good  Impulse Control:  developing   Reported Symptoms: Daily anxiety, obsessive thoughts, compulsive behavior, panic attacks  Risk Assessment: Danger to Self:  No Self-injurious Behavior: No Danger to Others: No Duty to Warn:no Physical Aggression / Violence:No  Access to Firearms a concern: No  Gang Involvement:No   Patient / guardian was educated about steps to take if suicide or homicide risk level increases between visits. While future psychiatric events cannot be accurately predicted, the patient does not currently require acute inpatient psychiatric care and does not currently meet [facility] involuntary commitment criteria.   Subjective:  Patient presents for session on time and in no apparent distress.  She shared family issues, how her mother continues to want her to proceed with the marital process.  She stated that she received afforded email from her mother which had information regarding a plane ticket for her to go to [employer] next March.  Patient shared how she attempted to talk with her mother but identified today through guided discovery that she has difficulty confronting others as she does not like conflict.  We discussed concepts of assertiveness and challenged her to evaluate which situations would potentially call on her to be more assertive  to advocate for her needs and express her desires clearly.  She went on to discuss how taking the steps will impact her sisters as well as her relationship with him for years to come.  We discussed potential outcomes if patient follows and adheres to her family's expectations versus her own.  Encouraged her to continue to consider these outcomes potentially and what she really wants for her life.  Today she did identify needing independence.  Interventions: CBT, Solution Focused and Strength-based  Diagnosis:    ICD-10-CM   1. Generalized anxiety disorder  F41.1   2. Obsessive-compulsive disorder, unspecified type  F42.9      Plan: Patient is to use CBT and coping skills to help manage her daily anxiety and obsessive compulsive behaviors.  Patient to continue to work on decreasing her compulsive behaviors, utilizing cognitive and behavioral skills.   long-term goal:   Reduce overall level, frequency, and intensity of the feelings of anxiety and obsessive-compulsive behaviors so that daily functioning is not impaired.  Short-term goal:  Verbalize an understanding of how thoughts, physical feelings, and behavioral actions contribute to anxiety and its treatment. Verbalize an understanding of the role that fearful thinking plays in creating fears, excessive worry, and anxiety / panic.  Assessment of progress:  progressing .  Waldron Session, [facility]

## 2019-06-13 ENCOUNTER — Ambulatory Visit (INDEPENDENT_AMBULATORY_CARE_PROVIDER_SITE_OTHER): Payer: 59 | Admitting: Mental Health

## 2019-06-13 ENCOUNTER — Other Ambulatory Visit: Payer: Self-pay

## 2019-06-13 DIAGNOSIS — F411 Generalized anxiety disorder: Secondary | ICD-10-CM

## 2019-06-13 NOTE — Progress Notes (Signed)
[facility] Counselor/Therapist Progress Note   Patient ID: Michaela Pena, MRN: [id number]  Date: 05/29/19  Timespent:  56 minutes   Treatment Type: Individual   Mental Status Exam:   Appearance:   casual  Behavior:  wnl  Motor:  wnl  Speech/Language:   Clear and Coherent  Affect:   Full range  Mood:  anxious, pleasant  Thought process:  Coherent and Relevant, goal directed  Thought content:    WDL, no SI/HI  Perceptual disturbances:    none  Orientation:  Full (Time, Place, and Person)  Attention:  Good  Concentration:  good  Memory:  WNL  Fund of knowledge:   Good  Insight:    Good  Judgment:   Good  Impulse Control:  developing   Reported Symptoms: Daily anxiety, obsessive thoughts, compulsive behavior, panic attacks  Risk Assessment: Danger to Self:  No Self-injurious Behavior: No Danger to Others: No Duty to Warn:no Physical Aggression / Violence:No  Access to Firearms a concern: No  Gang Involvement:No   Patient / guardian was educated about steps to take if suicide or homicide risk level increases between visits. While future psychiatric events cannot be accurately predicted, the patient does not currently require acute inpatient psychiatric care and does not currently meet [facility] involuntary commitment criteria.   Subjective:  Patient presents for session sharing progress.  She states she plans to visit her aunt out of state with her sisters starting tomorrow for about a week and a half.  She continues to share family expectations around her getting married in the strong religious influence in the process.  She shared recent family relationships, no changes in her mother's ongoing pressure in this area as well as appearing more "short" with patient recently.  She identified at some point now she will have to make the decision, "to be ostracized by the family or follow through by getting married".  She continues to not want to get married.  She  shared some recent interactions while she and her sister and another friend went on a beach trip, some of which made her feel uncomfortable.  She continues to take medications as prescribed and some specific triggers for the anxiety were discussed while on the trip.  She continues to make progress with decreased incidents of checking her gas tank possibly increased behavior with checking locks at night.  She identified no specific trigger for this increase more that her stress has been increased in general recently primarily due to family pressure.  Ways to communicate to advocate for her wants and needs was explored.  Provide support, understanding as she identified these needs in session and potential relational outcome with family members.  Interventions: CBT, Solution Focused and Strength-based  Diagnosis:    ICD-10-CM   1. Generalized anxiety disorder  F41.1      Plan: Patient is to use CBT and coping skills to help manage her daily anxiety and obsessive compulsive behaviors.  Patient to continue to work on decreasing her compulsive behaviors, utilizing cognitive and behavioral skills.   long-term goal:   Reduce overall level, frequency, and intensity of the feelings of anxiety and obsessive-compulsive behaviors so that daily functioning is not impaired.  Short-term goal:  Verbalize an understanding of how thoughts, physical feelings, and behavioral actions contribute to anxiety and its treatment. Verbalize an understanding of the role that fearful thinking plays in creating fears, excessive worry, and anxiety / panic.  Assessment of progress:  progressing .  Anson Oregon, [facility]

## 2019-06-26 ENCOUNTER — Ambulatory Visit (INDEPENDENT_AMBULATORY_CARE_PROVIDER_SITE_OTHER): Payer: 59 | Admitting: Mental Health

## 2019-06-26 ENCOUNTER — Other Ambulatory Visit: Payer: Self-pay

## 2019-06-26 DIAGNOSIS — F429 Obsessive-compulsive disorder, unspecified: Secondary | ICD-10-CM

## 2019-06-26 DIAGNOSIS — F411 Generalized anxiety disorder: Secondary | ICD-10-CM

## 2019-06-26 NOTE — Progress Notes (Signed)
[facility] Counselor/Therapist Progress Note   Patient ID: Michaela Pena, MRN: [id number]  Date: 06/26/19  Timespent:  56 minutes   Treatment Type: Individual   Mental Status Exam:   Appearance:   casual  Behavior:  wnl  Motor:  wnl  Speech/Language:   Clear and Coherent  Affect:   Full range  Mood:  anxious, pleasant  Thought process:  Coherent and Relevant, goal directed  Thought content:    WDL, no SI/HI  Perceptual disturbances:    none  Orientation:  Full (Time, Place, and Person)  Attention:  Good  Concentration:  good  Memory:  WNL  Fund of knowledge:   Good  Insight:    Good  Judgment:   Good  Impulse Control:  developing   Reported Symptoms: Daily anxiety, obsessive thoughts, compulsive behavior, panic attacks  Risk Assessment: Danger to Self:  No Self-injurious Behavior: No Danger to Others: No Duty to Warn:no Physical Aggression / Violence:No  Access to Firearms a concern: No  Gang Involvement:No   Patient / guardian was educated about steps to take if suicide or homicide risk level increases between visits. While future psychiatric events cannot be accurately predicted, the patient does not currently require acute inpatient psychiatric care and does not currently meet [facility] involuntary commitment criteria.   Subjective:  Patient presents for session sharing progress.  She stated that she enjoyed her trip to see family and that the trip went better as expected.  She stated that she was able to spend half of the trip with some friends she is known for years.  This was unexpected and she enjoyed the time together.  She says she was able to drive off over 13 hours with minimal stops to check her gas tank.  Her sisters drive home, she gets tense in the car often pushing her foot into the floor board as if to have the break when her sister drives.  She stated that she could have taken some of her medication to help with this anxiety but sh was  worried she would be unable to stay as alert to monitor the driving.  Through guided discovery, she identified how she knows her sister is a good enough driver, that is her issue to work on.  She also shared how recently it was raining and she thought that her car was floating due to the trunk being open.  She stated she had to check the trunk 3 separate times.  We explored ways to cope in these situations, thought stopping, ways to distract herself to allow for more calm, rational thoughts to decrease the anxiety.    Interventions: CBT, Solution Focused and Strength-based  Diagnosis:    ICD-10-CM   1. Generalized anxiety disorder  F41.1   2. Obsessive-compulsive disorder, unspecified type  F42.9      Plan: Patient is to use CBT and coping skills to help manage her daily anxiety and obsessive compulsive behaviors.  Patient to continue to work on decreasing her compulsive behaviors, utilizing cognitive and behavioral skills.   long-term goal:   Reduce overall level, frequency, and intensity of the feelings of anxiety and obsessive-compulsive behaviors so that daily functioning is not impaired.  Short-term goal:  Verbalize an understanding of how thoughts, physical feelings, and behavioral actions contribute to anxiety and its treatment. Verbalize an understanding of the role that fearful thinking plays in creating fears, excessive worry, and anxiety / panic.  Assessment of progress:  progressing .  [place]son [place], [facility]

## 2019-07-10 ENCOUNTER — Ambulatory Visit (INDEPENDENT_AMBULATORY_CARE_PROVIDER_SITE_OTHER): Payer: 59 | Admitting: Mental Health

## 2019-07-10 ENCOUNTER — Other Ambulatory Visit: Payer: Self-pay

## 2019-07-10 DIAGNOSIS — F411 Generalized anxiety disorder: Secondary | ICD-10-CM | POA: Diagnosis not present

## 2019-07-10 NOTE — Progress Notes (Signed)
     [facility] Counselor/Therapist Progress Note   Patient ID: Michaela Pena, MRN: [id number]  Date: 07/10/19  Timespent:  50  minutes   Treatment Type: Individual   Mental Status Exam:   Appearance:   casual  Behavior:  wnl  Motor:  wnl  Speech/Language:   Clear and Coherent  Affect:   Full range  Mood:  anxious, pleasant  Thought process:  Coherent and Relevant, goal directed  Thought content:    WDL, no SI/HI  Perceptual disturbances:    none  Orientation:  Full (Time, Place, and Person)  Attention:  Good  Concentration:  good  Memory:  WNL  Fund of knowledge:   Good  Insight:    Good  Judgment:   Good  Impulse Control:  developing   Reported Symptoms: Daily anxiety, obsessive thoughts, compulsive behavior, panic attacks  Risk Assessment: Danger to Self:  No Self-injurious Behavior: No Danger to Others: No Duty to Warn:no Physical Aggression / Violence:No  Access to Firearms a concern: No  Gang Involvement:No   Patient / guardian was educated about steps to take if suicide or homicide risk level increases between visits. While future psychiatric events cannot be accurately predicted, the patient does not currently require acute inpatient psychiatric care and does not currently meet [facility] involuntary commitment criteria.   Subjective:  Patient presents for session in no distress.  She shared how she has had less stress with family relating to getting married.  Reports that she is staying busy, working her job as well as Airline pilot.  We reviewed some previous session content related to some of her obsessive-compulsive behaviors.  She continues to make strides with not checking her gas tank as often, typically limited to once while driving or sometimes even none.  She continues to check locks in the house in the evening, in the past will check 3 times currently down to 2.  Through guided discovery, she identified specific behaviors that made  her more successful in reducing the checking behavior down to 1 time which lasted about 2 months.  She plans to follow through with these previous behaviors which are checking the locks brushing her teeth and then going down to her basement room reading a book for around an hour then check before going to bed.  Plan is for her to go at least 7 days.    Interventions: CBT, Solution Focused and Strength-based  Diagnosis:    ICD-10-CM   1. Generalized anxiety disorder  F41.1      Plan: Patient is to use CBT and coping skills to help manage her daily anxiety and obsessive compulsive behaviors.  Patient to continue to work on decreasing her compulsive behaviors, utilizing cognitive and behavioral skills.  Patient will go at least 7 days consecutively without checking her locks 2 times, only once.  long-term goal:   Reduce overall level, frequency, and intensity of the feelings of anxiety and obsessive-compulsive behaviors so that daily functioning is not impaired.  Short-term goal:  Verbalize an understanding of how thoughts, physical feelings, and behavioral actions contribute to anxiety and its treatment. Verbalize an understanding of the role that fearful thinking plays in creating fears, excessive worry, and anxiety / panic.\ Reduce compulsive behaviors by utilizing coping skills discussed. Patient to continue take medications as prescribed and report any concerns to prescribing provider.  Assessment of progress:  progressing .  Waldron Session, [facility]

## 2019-07-25 ENCOUNTER — Other Ambulatory Visit: Payer: Self-pay

## 2019-07-25 ENCOUNTER — Ambulatory Visit (INDEPENDENT_AMBULATORY_CARE_PROVIDER_SITE_OTHER): Payer: 59 | Admitting: Psychiatry

## 2019-07-25 ENCOUNTER — Ambulatory Visit: Payer: 59 | Admitting: Mental Health

## 2019-07-25 ENCOUNTER — Encounter: Payer: Self-pay | Admitting: Psychiatry

## 2019-07-25 DIAGNOSIS — F429 Obsessive-compulsive disorder, unspecified: Secondary | ICD-10-CM | POA: Diagnosis not present

## 2019-07-25 DIAGNOSIS — F411 Generalized anxiety disorder: Secondary | ICD-10-CM

## 2019-07-25 DIAGNOSIS — F3342 Major depressive disorder, recurrent, in full remission: Secondary | ICD-10-CM | POA: Diagnosis not present

## 2019-07-25 DIAGNOSIS — F900 Attention-deficit hyperactivity disorder, predominantly inattentive type: Secondary | ICD-10-CM | POA: Diagnosis not present

## 2019-07-25 MED ORDER — AMPHETAMINE SULFATE 10 MG PO TABS: 10.0000 mg | ORAL_TABLET | Freq: Every morning | ORAL | 0 refills | Status: DC

## 2019-07-25 MED ORDER — SERTRALINE HCL 100 MG PO TABS: 200.0000 mg | ORAL_TABLET | Freq: Every day | ORAL | 1 refills | Status: DC

## 2019-07-25 NOTE — Progress Notes (Signed)
[facility] Counselor/Therapist Progress Note   Patient ID: Michaela Pena, MRN: [id number]  Date: 07/25/19  Timespent:  53  minutes   Treatment Type: Individual therapy  Mental Status Exam:   Appearance:   casual  Behavior:  wnl  Motor:  wnl  Speech/Language:   Clear and Coherent  Affect:   Full range  Mood:  anxious, pleasant  Thought process:  Coherent and Relevant, goal directed  Thought content:    WDL, no SI/HI  Perceptual disturbances:    none  Orientation:  Full (Time, Place, and Person)  Attention:  Good  Concentration:  good  Memory:  WNL  Fund of knowledge:   Good  Insight:    Good  Judgment:   Good  Impulse Control:  developing   Reported Symptoms: Daily anxiety, obsessive thoughts, compulsive behavior, panic attacks  Risk Assessment: Danger to Self:  No Self-injurious Behavior: No Danger to Others: No Duty to Warn:no Physical Aggression / Violence:No  Access to Firearms a concern: No  Gang Involvement:No   Patient / guardian was educated about steps to take if suicide or homicide risk level increases between visits. While future psychiatric events cannot be accurately predicted, the patient does not currently require acute inpatient psychiatric care and does not currently meet [facility] involuntary commitment criteria.   Subjective:  Patient presents for session on time.  Discussed progress.  She stated that she challenged herself, went about 3 days over the last 2 weeks only taking her house locks once.  She stated that it was "annoying" during the process, the compulsion to continue to check locks is strong for her.  She has made progress with some of her obsessive-compulsive behaviors related to driving.  She stated that she Is not pulling over to check her gas cap.  She continues to drive without turning on the radio to listen to music or a pod cast.  She stated she would like to be able to do this but has to have silence while driving.   She identified the cognition "I feel like I need to hear everything to make sure everything will be okay".  Made plan in session to engage in 1 attempt to turn on her radio while driving over the next 2 weeks.  She stated she has made a lot of progress with not taking around her car, the tires etc. as she is not engaged in this behavior for several weeks.  Praised her for noted progress, collaboratively explored ways she has been able to take steps to make these wanting changes.  Interventions: CBT, Solution Focused and Strength-based  Diagnosis:    ICD-10-CM   1. Obsessive-compulsive disorder, unspecified type  F42.9      Plan: Patient is to use CBT and coping skills to help manage her daily anxiety and obsessive compulsive behaviors.  Patient to continue to work on decreasing her compulsive behaviors, utilizing cognitive and behavioral skills.  Patient will go at least 7 days consecutively without checking her locks 2 times, only once.   long-term goal:   Reduce overall level, frequency, and intensity of the feelings of anxiety and obsessive-compulsive behaviors so that daily functioning is not impaired.  Short-term goal:  Verbalize an understanding of how thoughts, physical feelings, and behavioral actions contribute to anxiety and its treatment. Verbalize an understanding of the role that fearful thinking plays in creating fears, excessive worry, and anxiety / panic.\ Reduce compulsive behaviors by utilizing coping skills discussed. Patient to continue take  medications as prescribed and report any concerns to prescribing provider.  Assessment of progress:  progressing .  Waldron Session, [facility]

## 2019-07-25 NOTE — Progress Notes (Signed)
Michaela Pena [id number] Nov 24, 1995 24 y.o.  Subjective:   Patient ID:  Michaela Pena is a 24 y.o. (DOB 16-May-1995) female.  Chief Complaint:  Chief Complaint  Patient presents with  . Follow-up    ADD, Anxiety, Depression    HPI Michaela Pena presents to the office today for follow-up of ADD, anxiety, and depression. She reports that she has taken Xanax prn about 3 times. She reports that she has only taken 1/2 tab of Xanax and never a whole tablet, and has not seen a significant improvement. Denies any recent panic attacks. She reports that anxiety has been "pretty good" and there has been some slight improvement in stressors. She reports that obsessive thoughts have been about the same. Some compulsions and these are also consistent. Denies depressed mood. She reports adequate sleep. Appetite has been ok. She reports that her weight has been stable. Energy and motivation have been good. She reports that she has not had to concentrate on any particular tasks recently since she is on a summer break. Denies SI.   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    Review of Systems:  Review of Systems  Cardiovascular: Negative for palpitations.  Neurological:       Notices slight hand tremor around 3-4 pm some days when trying to do fine motor tasks.     Medications: I have reviewed the patient's current medications.  Current Outpatient Medications  Medication Sig Dispense Refill  . Amphetamine Sulfate (EVEKEO) 10 MG TABS Take 10 mg by mouth every morning. 30 tablet 0  . [START ON 09/19/2019] Amphetamine Sulfate (EVEKEO) 10 MG TABS Take 10 mg by mouth every morning. 30 tablet 0  . [START ON 08/22/2019] Amphetamine Sulfate (EVEKEO) 10 MG TABS Take 10 mg by mouth every morning. 30 tablet 0  . levothyroxine (SYNTHROID) 25 MCG tablet Take 25 mcg by mouth daily before breakfast.    . sertraline (ZOLOFT) 100 MG tablet Take 2 tablets  (200 mg total) by mouth daily. 180 tablet 1  . ALPRAZolam (XANAX) 0.25 MG tablet Take 1/2-2 tabs po qd prn anxiety 30 tablet 1  . buPROPion (WELLBUTRIN XL) 150 MG 24 hr tablet Take 1 tablet (150 mg total) by mouth every morning. 90 tablet 1   No current facility-administered medications for this visit.    Medication Side Effects: None  Allergies: No Known Allergies  Past Medical History:  Diagnosis Date  . ADD (attention deficit disorder)   . Anxiety   . Thyroid disease     Family History  Problem Relation Age of Onset  . Anxiety disorder Mother   . Depression Mother   . Hypothyroidism Mother   . Anxiety disorder Maternal Grandmother   . Depression Maternal Grandmother   . Polycystic kidney disease Paternal Grandfather     Social History   Socioeconomic History  . Marital status: Single    Spouse name: Not on file  . Number of children: Not on file  . Years of education: Not on file  . Highest education level: Not on file  Occupational History  . Occupation: History Major at [employer]  Tobacco Use  . Smoking status: Never Smoker  . Smokeless tobacco: Never Used  Substance and Sexual Activity  . Alcohol use: Never  . Drug use: Not on file  . Sexual activity: Not on file  Other Topics Concern  . Not on file  Social History Narrative  .  Not on file   Social Determinants of Health   Financial Resource Strain:   . Difficulty of Paying Living Expenses:   Food Insecurity:   . Worried About Programme researcher, broadcasting/film/video in the Last Year:   . Barista in the Last Year:   Transportation Needs:   . Freight forwarder (Medical):   Marland Kitchen Lack of Transportation (Non-Medical):   Physical Activity:   . Days of Exercise per Week:   . Minutes of Exercise per Session:   Stress:   . Feeling of Stress :   Social Connections:   . Frequency of Communication with Friends and Family:   . Frequency of Social Gatherings with Friends and Family:   . Attends Religious Services:   .  Active Member of Clubs or Organizations:   . Attends Banker Meetings:   Marland Kitchen Marital Status:   Intimate Partner Violence:   . Fear of Current or Ex-Partner:   . Emotionally Abused:   Marland Kitchen Physically Abused:   . Sexually Abused:     Past Medical History, Surgical history, Social history, and Family history were reviewed and updated as appropriate.   Please see review of systems for further details on the patient's review from today.   Objective:   Physical Exam:  BP 109/61   Pulse 82   Wt 147 lb (66.7 kg)   BMI 21.09 kg/m   Physical Exam Constitutional:      General: She is not in acute distress. Musculoskeletal:        General: No deformity.  Neurological:     Mental Status: She is alert and oriented to person, place, and time.     Coordination: Coordination normal.  Psychiatric:        Attention and Perception: Attention and perception normal. She does not perceive auditory or visual hallucinations.        Mood and Affect: Mood normal. Mood is not anxious or depressed. Affect is not labile, blunt, angry or inappropriate.        Speech: Speech normal.        Behavior: Behavior normal.        Thought Content: Thought content normal. Thought content is not paranoid or delusional. Thought content does not include homicidal or suicidal ideation. Thought content does not include homicidal or suicidal plan.        Cognition and Memory: Cognition and memory normal.        Judgment: Judgment normal.     Comments: Insight intact     Lab Review:     Component Value Date/Time   NA 141 05/12/2019 1120   K 4.2 05/12/2019 1120   CL 107 05/12/2019 1120   CO2 27 05/12/2019 1120   GLUCOSE 94 05/12/2019 1120   BUN 12 05/12/2019 1120   CREATININE 0.82 05/12/2019 1120   CALCIUM 9.1 05/12/2019 1120   PROT 6.9 05/12/2019 1120   ALBUMIN 4.1 05/12/2019 1120   AST 15 05/12/2019 1120   ALT 11 05/12/2019 1120   ALKPHOS 85 05/12/2019 1120   BILITOT 0.4 05/12/2019 1120    GFRNONAA >60 05/12/2019 1120   GFRAA >60 05/12/2019 1120       Component Value Date/Time   WBC 5.8 05/12/2019 1120   RBC 4.48 05/12/2019 1120   HGB 13.3 05/12/2019 1120   HCT 40.8 05/12/2019 1120   PLT 232 05/12/2019 1120   MCV 91.1 05/12/2019 1120   MCH 29.7 05/12/2019 1120   MCHC 32.6 05/12/2019 1120  RDW 12.1 05/12/2019 1120   LYMPHSABS 1.8 05/12/2019 1120   MONOABS 0.4 05/12/2019 1120   EOSABS 0.1 05/12/2019 1120   BASOSABS 0.0 05/12/2019 1120    No results found for: POCLITH, LITHIUM   No results found for: PHENYTOIN, PHENOBARB, VALPROATE, CBMZ   .res Assessment: Plan:   Continue of Evekeo 10 mg daily for attention deficit disorder.  Discussed that manual pulse was significantly lower than in pulse reading with BP wrist cuff.  Will monitor pulse manually in the future.  Weight has stabilized and discussed that decrease in appetite and weight may have occurred when she started taking Evekeo consistently.  We will continue to monitor her weight. Continue sertraline 200 mg daily for anxiety depression. Patient to follow-up in 3 months or sooner if clinically indicated. Recommend continuing psychotherapy with Elio Forget, [facility]. Patient advised to contact office with any questions, adverse effects, or acute worsening in signs and symptoms.  Heela was seen today for follow-up.  Diagnoses and all orders for this visit:  Attention deficit hyperactivity disorder (ADHD), predominantly inattentive type -     Amphetamine Sulfate (EVEKEO) 10 MG TABS; Take 10 mg by mouth every morning. -     Amphetamine Sulfate (EVEKEO) 10 MG TABS; Take 10 mg by mouth every morning.  Recurrent major depressive disorder, in full remission (HCC) -     sertraline (ZOLOFT) 100 MG tablet; Take 2 tablets (200 mg total) by mouth daily.  Generalized anxiety disorder -     sertraline (ZOLOFT) 100 MG tablet; Take 2 tablets (200 mg total) by mouth daily.     Please see After Visit Summary for  patient specific instructions.  Future Appointments  Date Time Provider Department Center  08/07/2019  4:00 PM Waldron Session, [facility] CP-CP None  09/04/2019  2:00 PM Waldron Session, [facility] CP-CP None  10/25/2019  8:30 AM Corie Chiquito, [facility]NP CP-CP None    No orders of the defined types were placed in this encounter.   -------------------------------

## 2019-08-07 ENCOUNTER — Other Ambulatory Visit: Payer: Self-pay

## 2019-08-07 ENCOUNTER — Ambulatory Visit (INDEPENDENT_AMBULATORY_CARE_PROVIDER_SITE_OTHER): Payer: 59 | Admitting: Mental Health

## 2019-08-07 DIAGNOSIS — F429 Obsessive-compulsive disorder, unspecified: Secondary | ICD-10-CM

## 2019-08-07 DIAGNOSIS — F411 Generalized anxiety disorder: Secondary | ICD-10-CM | POA: Diagnosis not present

## 2019-08-07 NOTE — Progress Notes (Signed)
[facility] Counselor/Therapist Progress Note   Patient ID: Michaela Pena, MRN: [id number]  Date: 08/07/19  Timespent:  55  minutes   Treatment Type: Individual therapy  Mental Status Exam:   Appearance:   casual  Behavior:  wnl  Motor:  wnl  Speech/Language:   Clear and Coherent  Affect:   Full range  Mood:  anxious, pleasant  Thought process:  Coherent and Relevant, goal directed  Thought content:    WDL, no SI/HI  Perceptual disturbances:    none  Orientation:  Full (Time, Place, and Person)  Attention:  Good  Concentration:  good  Memory:  WNL  Fund of knowledge:   Good  Insight:    Good  Judgment:   Good  Impulse Control:  developing   Reported Symptoms: Daily anxiety, obsessive thoughts, compulsive behavior, panic attacks  Risk Assessment: Danger to Self:  No Self-injurious Behavior: No Danger to Others: No Duty to Warn:no Physical Aggression / Violence:No  Access to Firearms a concern: No  Gang Involvement:No   Patient / guardian was educated about steps to take if suicide or homicide risk level increases between visits. While future psychiatric events cannot be accurately predicted, the patient does not currently require acute inpatient psychiatric care and does not currently meet [facility] involuntary commitment criteria.   Subjective:  Patient presents for session in no distress.  Shared progress and recent events.  She stated that her family from up [place], specifically her grandmother discussed concerns with her in a recent phone call due to clothing she was wearing in a picture that was posted online by her mother.  She stated that it was on social media it was a family picture and this is not the first time her grandmother has been critical of her retire.  She stated she acknowledges her while discussing but does not agree.  She tries to keep the peace with her grandmother by not being conflictual in the conversation.  She denied feeling  distressed by the situation.  Discussing her obsessive-compulsive behaviors, she stated she is made minimal progress since our last visit, continues to check locks most days and feels that some of her past behavior regarding checking her gas To make sure it is closed while making trips has slowly started to increase again.  We reviewed steps, systematic desensitization process to utilize between visits.  Through guided discovery, she divulged many other obsessive compulsive tendencies such as handwashing, germs.  She stated that she wants everyone in the home to make sure there are take their shoes off before entering the home, how her mother and sister at times do not follow through with this and instances where they are bringing in groceries.  Gave her homework assignment related to putting these identified behaviors in 2 columns 1 column which she feels are not logical and another column which she feels are logical we plan to discuss neck session.  Interventions: CBT, Solution Focused and Strength-based  Diagnosis:    ICD-10-CM   1. Obsessive-compulsive disorder, unspecified type  F42.9   2. Generalized anxiety disorder  F41.1      Plan: Patient is to use CBT and coping skills to help manage her daily anxiety and obsessive compulsive behaviors.  Patient to continue to work on decreasing her compulsive behaviors, utilizing cognitive and behavioral skills.  Patient to continue to utilize coping skills discussed in session to decrease obsessive compulsive behaviors.  long-term goal:   Reduce overall level, frequency, and intensity  of the feelings of anxiety and obsessive-compulsive behaviors so that daily functioning is not impaired.  Short-term goal:  Verbalize an understanding of how thoughts, physical feelings, and behavioral actions contribute to anxiety and its treatment. Verbalize an understanding of the role that fearful thinking plays in creating fears, excessive worry, and anxiety /  panic.\ Reduce compulsive behaviors by utilizing coping skills discussed. Patient to continue take medications as prescribed and report any concerns to prescribing provider.  Assessment of progress:  progressing .  Waldron Session, [facility]

## 2019-08-28 ENCOUNTER — Ambulatory Visit: Payer: 59 | Admitting: Mental Health

## 2019-09-04 ENCOUNTER — Ambulatory Visit (INDEPENDENT_AMBULATORY_CARE_PROVIDER_SITE_OTHER): Payer: 59 | Admitting: Mental Health

## 2019-09-04 ENCOUNTER — Other Ambulatory Visit: Payer: Self-pay

## 2019-09-04 DIAGNOSIS — F411 Generalized anxiety disorder: Secondary | ICD-10-CM | POA: Diagnosis not present

## 2019-09-04 DIAGNOSIS — F429 Obsessive-compulsive disorder, unspecified: Secondary | ICD-10-CM

## 2019-09-04 NOTE — Progress Notes (Signed)
     [facility] Counselor/Therapist Progress Note   Patient ID: Michaela Pena, MRN: [id number]  Date: 09/04/19  Timespent:  53 minutes   Treatment Type: Individual therapy  Mental Status Exam:   Appearance:   casual  Behavior:  wnl  Motor:  wnl  Speech/Language:   Clear and Coherent  Affect:   Full range  Mood:  anxious, pleasant  Thought process:  Coherent and Relevant, goal directed  Thought content:    WDL, no SI/HI  Perceptual disturbances:    none  Orientation:  Full (Time, Place, and Person)  Attention:  Good  Concentration:  good  Memory:  WNL  Fund of knowledge:   Good  Insight:    Good  Judgment:   Good  Impulse Control:  developing   Reported Symptoms: Daily anxiety, obsessive thoughts, compulsive behavior, panic attacks  Risk Assessment: Danger to Self:  No Self-injurious Behavior: No Danger to Others: No Duty to Warn:no Physical Aggression / Violence:No  Access to Firearms a concern: No  Gang Involvement:No   Patient / guardian was educated about steps to take if suicide or homicide risk level increases between visits. While future psychiatric events cannot be accurately predicted, the patient does not currently require acute inpatient psychiatric care and does not currently meet [facility] involuntary commitment criteria.   Subjective:  Patient presents recession in no distress, shared progress. She said she was in [place] for about 3 weeks helping a family friend. She said she had some anxiety on the trip, shared more experiences with a relationship of the friend she went with which is the wife of the couple that has been staying with the family for the past year. She said that she had to take her Xanax during the ride home due to her anxiety levels and felt it was helpful. She said that since she's been back home, she initially noticed she had less I'm obsessive and compulsive behaviors but after a few days they returned to previous levels.  Assessed more family history, specifically with her father. She stated her father was often upset, "he always found something to be upset about". She said she was often tense around him. Encouraged her to consider the relationship between some of these past experiences and how much of a factor they they play with her current anxiety levels.Reviewed some coping skills, systematic desensitization, cognitive reframing etc.  Interventions: CBT, Solution Focused and Strength-based  Diagnosis:    ICD-10-CM   1. Obsessive-compulsive disorder, unspecified type  F42.9   2. Generalized anxiety disorder  F41.1      Plan: Patient is to use CBT and coping skills to help manage her daily anxiety and obsessive compulsive behaviors.  Patient to continue to work on decreasing her compulsive behaviors, utilizing cognitive and behavioral skills.  Patient to continue to utilize coping skills discussed in session to decrease obsessive compulsive behaviors.  long-term goal:   Reduce overall level, frequency, and intensity of the feelings of anxiety and obsessive-compulsive behaviors so that daily functioning is not impaired.  Short-term goal:  Verbalize an understanding of how thoughts, physical feelings, and behavioral actions contribute to anxiety and its treatment. Verbalize an understanding of the role that fearful thinking plays in creating fears, excessive worry, and anxiety / panic.\ Reduce compulsive behaviors by utilizing coping skills discussed. Patient to continue take medications as prescribed and report any concerns to prescribing provider.  Assessment of progress:  progressing .  Waldron Session, [facility]

## 2019-09-11 ENCOUNTER — Ambulatory Visit: Payer: 59 | Admitting: Mental Health

## 2019-09-20 ENCOUNTER — Ambulatory Visit (INDEPENDENT_AMBULATORY_CARE_PROVIDER_SITE_OTHER): Payer: 59 | Admitting: Mental Health

## 2019-09-20 ENCOUNTER — Other Ambulatory Visit: Payer: Self-pay

## 2019-09-20 DIAGNOSIS — F429 Obsessive-compulsive disorder, unspecified: Secondary | ICD-10-CM | POA: Diagnosis not present

## 2019-09-20 NOTE — Progress Notes (Signed)
°     [facility] Counselor/Therapist Progress Note   Patient ID: Michaela Pena, MRN: [id number]  Date: 09/20/19  Timespent:  54 minutes   Treatment Type: Individual therapy  Mental Status Exam:   Appearance:   casual  Behavior:  wnl  Motor:  wnl  Speech/Language:   Clear and Coherent  Affect:   Full range  Mood:  anxious, pleasant  Thought process:  Coherent and Relevant, goal directed  Thought content:    WDL, no SI/HI  Perceptual disturbances:    none  Orientation:  Full (Time, Place, and Person)  Attention:  Good  Concentration:  good  Memory:  WNL  Fund of knowledge:   Good  Insight:    Good  Judgment:   Good  Impulse Control:  developing   Reported Symptoms: Daily anxiety, obsessive thoughts, compulsive behavior, panic attacks  Risk Assessment: Danger to Self:  No Self-injurious Behavior: No Danger to Others: No Duty to Warn:no Physical Aggression / Violence:No  Access to Firearms a concern: No  Gang Involvement:No   Patient / guardian was educated about steps to take if suicide or homicide risk level increases between visits. While future psychiatric events cannot be accurately predicted, the patient does not currently require acute inpatient psychiatric care and does not currently meet [facility] involuntary commitment criteria.   Subjective:  Patient presents for session on time.  Shared progress and recent events.  She stated that she has not gotten any family pressure regarding getting married.  She stated that she knows the approaching timeframe next March where her mother bought a plane ticket so she could attend seminary will have to be discussed at some point.  Ultimately, she does not want to go and identified how she would like to have her own independence in the next 1 to 2 years, living on her own and working.  Discussed progress with some of her obsessive-compulsive behaviors, specifically while checking.  Through guided discovery, she  identified that she checks her locks 3 times an evening, that the checking behavior has been present for the last 7 years.  She plans to take steps toward reducing this behavior as she identified the second instance of checking locks was unnecessary.  She stated she feels it will be a challenge but committed to taking the step between sessions.  Assisted her in identifying her rationale for the behavior.  She shared how when she thinks "rationally" some of the behaviors unnecessary.  Interventions: CBT, Solution Focused and Strength-based  Diagnosis:    ICD-10-CM   1. Obsessive-compulsive disorder, unspecified type  F42.9      Plan: Patient is to use CBT and coping skills to help manage her daily anxiety and obsessive compulsive behaviors.  Patient to continue to work on decreasing her compulsive behaviors, utilizing cognitive and behavioral skills.  Patient to continue to utilize coping skills discussed in session to decrease obsessive compulsive behaviors.  long-term goal:   Reduce overall level, frequency, and intensity of the feelings of anxiety and obsessive-compulsive behaviors so that daily functioning is not impaired.  Short-term goal:  Verbalize an understanding of how thoughts, physical feelings, and behavioral actions contribute to anxiety and its treatment. Verbalize an understanding of the role that fearful thinking plays in creating fears, excessive worry, and anxiety / panic.\ Reduce compulsive behaviors by utilizing coping skills discussed. Patient to continue take medications as prescribed and report any concerns to prescribing provider.  Assessment of progress:  progressing .  Waldron Session, [facility]

## 2019-10-02 ENCOUNTER — Other Ambulatory Visit: Payer: Self-pay

## 2019-10-02 ENCOUNTER — Ambulatory Visit (INDEPENDENT_AMBULATORY_CARE_PROVIDER_SITE_OTHER): Payer: 59 | Admitting: Mental Health

## 2019-10-02 DIAGNOSIS — F429 Obsessive-compulsive disorder, unspecified: Secondary | ICD-10-CM | POA: Diagnosis not present

## 2019-10-02 NOTE — Progress Notes (Signed)
     [facility] Counselor/Therapist Progress Note   Patient ID: Michaela Pena, MRN: [id number]  Date: 10/02/19  Timespent:  54 minutes   Treatment Type: Individual therapy  Mental Status Exam:   Appearance:   casual  Behavior:  wnl  Motor:  wnl  Speech/Language:   Clear and Coherent  Affect:   Full range  Mood:  anxious, pleasant  Thought process:  Coherent and Relevant, goal directed  Thought content:    WDL, no SI/HI  Perceptual disturbances:    none  Orientation:  Full (Time, Place, and Person)  Attention:  Good  Concentration:  good  Memory:  WNL  Fund of knowledge:   Good  Insight:    Good  Judgment:   Good  Impulse Control:  developing   Reported Symptoms: Daily anxiety, obsessive thoughts, compulsive behavior, panic attacks  Risk Assessment: Danger to Self:  No Self-injurious Behavior: No Danger to Others: No Duty to Warn:no Physical Aggression / Violence:No  Access to Firearms a concern: No  Gang Involvement:No   Patient / guardian was educated about steps to take if suicide or homicide risk level increases between visits. While future psychiatric events cannot be accurately predicted, the patient does not currently require acute inpatient psychiatric care and does not currently meet [facility] involuntary commitment criteria.   Subjective:  Patient presents for session.  Sharing progress and recent events.  She stated she decided to discontinue going to school this last semester prior to graduation.  She stated she did so to avoid going to seminary in the spring.  She shared how her mother eventually learned of her decision and told her that she must attend seminary.  Patient continues to express not wanting to go, most of the session was centered around allowing her to process these events and how she ultimately does not feel she can change her mother's mind.  Patient expressed having difficulty talking with her mother historically regarding her  decision centered around faith.  Her mother is devout in her faith.  Patient expressed that she was unsure of what to do going forward, the trip to seminary will take place in March, unless she talks with her mother.  We explored collaboratively ways to communicate her thoughts and feelings openly with her mother.  We discussed how taking this step, albeit challenging for patient, may reduce some of her stress and anxiety.  Interventions: CBT, Solution Focused and Strength-based  Diagnosis:    ICD-10-CM   1. Obsessive-compulsive disorder, unspecified type  F42.9      Plan: Patient is to use CBT and coping skills to help manage her daily anxiety and obsessive compulsive behaviors.  Patient to continue to work on decreasing her compulsive behaviors, utilizing cognitive and behavioral skills.  Patient to continue to utilize coping skills discussed in session to decrease obsessive compulsive behaviors.  long-term goal:   Reduce overall level, frequency, and intensity of the feelings of anxiety and obsessive-compulsive behaviors so that daily functioning is not impaired.  Short-term goal:  Verbalize an understanding of how thoughts, physical feelings, and behavioral actions contribute to anxiety and its treatment. Verbalize an understanding of the role that fearful thinking plays in creating fears, excessive worry, and anxiety / panic.\ Reduce compulsive behaviors by utilizing coping skills discussed. Patient to continue take medications as prescribed and report any concerns to prescribing provider.  Assessment of progress:  progressing .  Waldron Session, [facility]

## 2019-10-16 ENCOUNTER — Other Ambulatory Visit: Payer: Self-pay

## 2019-10-16 ENCOUNTER — Ambulatory Visit (INDEPENDENT_AMBULATORY_CARE_PROVIDER_SITE_OTHER): Payer: 59 | Admitting: Mental Health

## 2019-10-16 DIAGNOSIS — F411 Generalized anxiety disorder: Secondary | ICD-10-CM | POA: Diagnosis not present

## 2019-10-16 DIAGNOSIS — F429 Obsessive-compulsive disorder, unspecified: Secondary | ICD-10-CM | POA: Diagnosis not present

## 2019-10-16 NOTE — Progress Notes (Signed)
[facility] Counselor/Therapist Progress Note   Patient ID: Michaela Pena, MRN: [id number]  Date: 10/16/19  Timespent:  54 minutes   Treatment Type: Individual therapy  Mental Status Exam:   Appearance:   casual  Behavior:  wnl  Motor:  wnl  Speech/Language:   Clear and Coherent  Affect:   Full range  Mood:  anxious, pleasant  Thought process:  Coherent and Relevant, goal directed  Thought content:    WDL, no SI/HI  Perceptual disturbances:    none  Orientation:  Full (Time, Place, and Person)  Attention:  Good  Concentration:  good  Memory:  WNL  Fund of knowledge:   Good  Insight:    Good  Judgment:   Good  Impulse Control:  developing   Reported Symptoms: Daily anxiety, obsessive thoughts, compulsive behavior, panic attacks  Risk Assessment: Danger to Self:  No Self-injurious Behavior: No Danger to Others: No Duty to Warn:no Physical Aggression / Violence:No  Access to Firearms a concern: No  Gang Involvement:No   Patient / guardian was educated about steps to take if suicide or homicide risk level increases between visits. While future psychiatric events cannot be accurately predicted, the patient does not currently require acute inpatient psychiatric care and does not currently meet [facility] involuntary commitment criteria.   Subjective:  Patient presents for session on time.  Shared progress since last visit.  She stated that her mother has not run up her going to seminary in the spring recently.  She stated that she is coping with the loss of a close friend who passed away last week.  Discussed her decision of whether she is going to seminary or not in the spring.  Patient maintains that she does not want to go.  She stated she is trying not to think about it and shared how she will make the decision at the last minute.  We discussed the potential benefits of giving herself time to think it through as it is an important life decision.  Discussed  thought blocking once she feels she has made her decision to allow her to decrease anxiety until she eventually will have to discuss it with her mother.  She stated she has made progress with checking her gas In her car, not stopping as often.  She stated that the variety of different trips she takes as she is working 2 jobs and babysitting right now is helped.  She stated when there are routines where she drives the same rides every day, the compulsion to pull of her may increase.  She continues to struggle with checking locks especially in the evening.  Plan was made for her to eliminate the second time she checks locks as she was able to identify that was irrational to continue to take this step.  Interventions: CBT, Solution Focused and Strength-based  Diagnosis:    ICD-10-CM   1. Obsessive-compulsive disorder, unspecified type  F42.9   2. Generalized anxiety disorder  F41.1      Plan: Patient is to use CBT and coping skills to help manage her daily anxiety and obsessive compulsive behaviors.  Patient to continue to work on decreasing her compulsive behaviors, utilizing cognitive and behavioral skills.  Patient to continue to utilize coping skills discussed in session to decrease obsessive compulsive behaviors.  long-term goal:   Reduce overall level, frequency, and intensity of the feelings of anxiety and obsessive-compulsive behaviors so that daily functioning is not impaired.  Short-term goal:  Verbalize an understanding of how thoughts, physical feelings, and behavioral actions contribute to anxiety and its treatment. Verbalize an understanding of the role that fearful thinking plays in creating fears, excessive worry, and anxiety / panic.\ Reduce compulsive behaviors by utilizing coping skills discussed. Patient to continue take medications as prescribed and report any concerns to prescribing provider.  Assessment of progress:  progressing .  Waldron Session, [facility]

## 2019-10-25 ENCOUNTER — Encounter: Payer: Self-pay | Admitting: Psychiatry

## 2019-10-25 ENCOUNTER — Other Ambulatory Visit: Payer: Self-pay

## 2019-10-25 ENCOUNTER — Telehealth: Payer: Self-pay

## 2019-10-25 ENCOUNTER — Ambulatory Visit (INDEPENDENT_AMBULATORY_CARE_PROVIDER_SITE_OTHER): Payer: 59 | Admitting: Psychiatry

## 2019-10-25 VITALS — BP 105/66 | HR 95 | Wt 158.0 lb

## 2019-10-25 DIAGNOSIS — F900 Attention-deficit hyperactivity disorder, predominantly inattentive type: Secondary | ICD-10-CM

## 2019-10-25 DIAGNOSIS — F429 Obsessive-compulsive disorder, unspecified: Secondary | ICD-10-CM | POA: Diagnosis not present

## 2019-10-25 DIAGNOSIS — F411 Generalized anxiety disorder: Secondary | ICD-10-CM

## 2019-10-25 DIAGNOSIS — F3342 Major depressive disorder, recurrent, in full remission: Secondary | ICD-10-CM | POA: Diagnosis not present

## 2019-10-25 MED ORDER — FLUOXETINE HCL 20 MG PO CAPS: 20.0000 mg | ORAL_CAPSULE | Freq: Every day | ORAL | 1 refills | Status: DC

## 2019-10-25 MED ORDER — SERTRALINE HCL 100 MG PO TABS: ORAL_TABLET | ORAL | 1 refills | Status: DC

## 2019-10-25 MED ORDER — AMPHETAMINE SULFATE 10 MG PO TABS: 10.0000 mg | ORAL_TABLET | Freq: Every morning | ORAL | 0 refills | Status: DC

## 2019-10-25 NOTE — Telephone Encounter (Signed)
Prior authorization renewal submitted and approved for AMPHETAMINE 10 MG #30 with [employer] ID# [id number] effective 10/19/2019-10/18/2020, PA# [id number]

## 2019-10-25 NOTE — Progress Notes (Signed)
Michaela Pena [id number] 1995/06/09 24 y.o.  Subjective:   Patient ID:  Michaela Pena is a 24 y.o. (DOB 1995-09-16) female.  Chief Complaint:  Chief Complaint  Patient presents with  . Anxiety  . Depression  . ADD    HPI Aris Lot presents to the office today for follow-up of anxiety, depression, and ADD. She reports that she had been doing well overall. She report occ anxiety and that this has been manageable. No change in compulsions.  She reports that her mood has been "good." Denies depression or irritability. Sleeping well and estimates sleeping about 8-9 hours. Appetite has been stable. Energy and motivation is ok. She reports that concentration has been ok. Denies SI.    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  Has not used Xanax prn recently. She reports that she has not been taking meds consistently. Denies any discontinuation s/s. Tries to take Sertraline every other day and that this does not happen consistently.   Past Psychiatric Medication Trials: Wellbutrin XL Sertraline Adderall XR- Effective, however unable to tolerate due to n/v Adderall immediate release- side effects    Review of Systems:  Review of Systems  Cardiovascular: Negative for palpitations.  Musculoskeletal: Negative for gait problem.  Neurological: Negative for tremors.  Psychiatric/Behavioral:       Please refer to HPI    Medications: I have reviewed the patient's current medications.  Current Outpatient Medications  Medication Sig Dispense Refill  . ALPRAZolam (XANAX) 0.25 MG tablet Take 1/2-2 tabs po qd prn anxiety 30 tablet 1  . Amphetamine Sulfate (EVEKEO) 10 MG TABS Take 10 mg by mouth every morning. 30 tablet 0  . Amphetamine Sulfate (EVEKEO) 10 MG TABS Take 10 mg by mouth every morning. 30 tablet 0  . [START ON 11/22/2019] Amphetamine Sulfate (EVEKEO) 10 MG TABS Take 10 mg by mouth every morning. 30 tablet 0  . buPROPion (WELLBUTRIN XL) 150 MG 24 hr tablet  Take 1 tablet (150 mg total) by mouth every morning. 90 tablet 1  . FLUoxetine (PROZAC) 20 MG capsule Take 1 capsule (20 mg total) by mouth daily. 30 capsule 1  . levothyroxine (SYNTHROID) 25 MCG tablet Take 25 mcg by mouth daily before breakfast.    . sertraline (ZOLOFT) 100 MG tablet Take 1 tablet daily for a week, then 1/2 tablet daily for a week, then stop 180 tablet 1   No current facility-administered medications for this visit.    Medication Side Effects: None  Allergies: No Known Allergies  Past Medical History:  Diagnosis Date  . ADD (attention deficit disorder)   . Anxiety   . Thyroid disease     Family History  Problem Relation Age of Onset  . Anxiety disorder Mother   . Depression Mother   . Hypothyroidism Mother   . Anxiety disorder Maternal Grandmother   . Depression Maternal Grandmother   . Polycystic kidney disease Paternal Grandfather     Social History   Socioeconomic History  . Marital status: Single    Spouse name: Not on file  . Number of children: Not on file  . Years of education: Not on file  . Highest education level: Not on file  Occupational History  . Occupation: History Major at [employer]  Tobacco Use  . Smoking status: Never Smoker  . Smokeless tobacco: Never Used  Substance and Sexual Activity  . Alcohol use: Never  . Drug use: Not on file  . Sexual activity: Not on file  Other  Topics Concern  . Not on file  Social History Narrative  . Not on file   Social Determinants of Health   Financial Resource Strain:   . Difficulty of Paying Living Expenses: Not on file  Food Insecurity:   . Worried About Programme researcher, broadcasting/film/video in the Last Year: Not on file  . Ran Out of Food in the Last Year: Not on file  Transportation Needs:   . Lack of Transportation (Medical): Not on file  . Lack of Transportation (Non-Medical): Not on file  Physical Activity:   . Days of Exercise per Week: Not on file  . Minutes of Exercise per Session: Not on file   Stress:   . Feeling of Stress : Not on file  Social Connections:   . Frequency of Communication with Friends and Family: Not on file  . Frequency of Social Gatherings with Friends and Family: Not on file  . Attends Religious Services: Not on file  . Active Member of Clubs or Organizations: Not on file  . Attends Banker Meetings: Not on file  . Marital Status: Not on file  Intimate Partner Violence:   . Fear of Current or Ex-Partner: Not on file  . Emotionally Abused: Not on file  . Physically Abused: Not on file  . Sexually Abused: Not on file    Past Medical History, Surgical history, Social history, and Family history were reviewed and updated as appropriate.   Please see review of systems for further details on the patient's review from today.   Objective:   Physical Exam:  BP 105/66   Pulse 95   Wt 158 lb (71.7 kg)   BMI 22.67 kg/m   Physical Exam Constitutional:      General: She is not in acute distress. Musculoskeletal:        General: No deformity.  Neurological:     Mental Status: She is alert and oriented to person, place, and time.     Coordination: Coordination normal.  Psychiatric:        Attention and Perception: Attention and perception normal. She does not perceive auditory or visual hallucinations.        Mood and Affect: Mood is not anxious or depressed. Affect is blunt. Affect is not labile, angry or inappropriate.        Speech: Speech normal.        Behavior: Behavior is withdrawn. Behavior is cooperative.        Thought Content: Thought content normal. Thought content is not paranoid or delusional. Thought content does not include homicidal or suicidal ideation. Thought content does not include homicidal or suicidal plan.        Cognition and Memory: Cognition and memory normal.        Judgment: Judgment normal.     Comments: Insight intact     Lab Review:     Component Value Date/Time   NA 141 05/12/2019 1120   K 4.2  05/12/2019 1120   CL 107 05/12/2019 1120   CO2 27 05/12/2019 1120   GLUCOSE 94 05/12/2019 1120   BUN 12 05/12/2019 1120   CREATININE 0.82 05/12/2019 1120   CALCIUM 9.1 05/12/2019 1120   PROT 6.9 05/12/2019 1120   ALBUMIN 4.1 05/12/2019 1120   AST 15 05/12/2019 1120   ALT 11 05/12/2019 1120   ALKPHOS 85 05/12/2019 1120   BILITOT 0.4 05/12/2019 1120   GFRNONAA >60 05/12/2019 1120   GFRAA >60 05/12/2019 1120  Component Value Date/Time   WBC 5.8 05/12/2019 1120   RBC 4.48 05/12/2019 1120   HGB 13.3 05/12/2019 1120   HCT 40.8 05/12/2019 1120   PLT 232 05/12/2019 1120   MCV 91.1 05/12/2019 1120   MCH 29.7 05/12/2019 1120   MCHC 32.6 05/12/2019 1120   RDW 12.1 05/12/2019 1120   LYMPHSABS 1.8 05/12/2019 1120   MONOABS 0.4 05/12/2019 1120   EOSABS 0.1 05/12/2019 1120   BASOSABS 0.0 05/12/2019 1120    No results found for: POCLITH, LITHIUM   No results found for: PHENYTOIN, PHENOBARB, VALPROATE, CBMZ   .res Assessment: Plan:   Discussed that it may be helpful to switch Sertraline to Prozac due to long half-life since she reports that she is having difficulty taking medications consistently. Discussed potential benefits, risks, and side effects of Prozac. Pt agrees to trial of Prozac. Will start Prozac 20 mg po qd. Will decrease Sertraline to 100 mg po qd for one week, then decrease to 1/2 tablet daily for a week, then stop.  Continue Evekeo for ADD.  Continue therapy with Elio Forget, [facility].  Pt to follow-up in 4-6 hours or sooner if clinically indicated.  Patient advised to contact office with any questions, adverse effects, or acute worsening in signs and symptoms.  Malcolm was seen today for anxiety, depression and add.  Diagnoses and all orders for this visit:  Obsessive-compulsive disorder, unspecified type -     FLUoxetine (PROZAC) 20 MG capsule; Take 1 capsule (20 mg total) by mouth daily.  Recurrent major depressive disorder, in full remission (HCC) -      FLUoxetine (PROZAC) 20 MG capsule; Take 1 capsule (20 mg total) by mouth daily. -     sertraline (ZOLOFT) 100 MG tablet; Take 1 tablet daily for a week, then 1/2 tablet daily for a week, then stop  Generalized anxiety disorder -     FLUoxetine (PROZAC) 20 MG capsule; Take 1 capsule (20 mg total) by mouth daily. -     sertraline (ZOLOFT) 100 MG tablet; Take 1 tablet daily for a week, then 1/2 tablet daily for a week, then stop  Attention deficit hyperactivity disorder (ADHD), predominantly inattentive type -     Amphetamine Sulfate (EVEKEO) 10 MG TABS; Take 10 mg by mouth every morning.     Please see After Visit Summary for patient specific instructions.  Future Appointments  Date Time Provider Department Center  10/30/2019  1:00 PM Waldron Session, [facility] None  11/13/2019  1:00 PM Waldron Session, [facility] None  11/27/2019  1:00 PM Waldron Session, [facility] CP-[facility] None  12/04/2019  9:15 AM Corie Chiquito, [facility]NP CP-CP None    No orders of the defined types were placed in this encounter.   -------------------------------

## 2019-10-30 ENCOUNTER — Other Ambulatory Visit: Payer: Self-pay

## 2019-10-30 ENCOUNTER — Ambulatory Visit (INDEPENDENT_AMBULATORY_CARE_PROVIDER_SITE_OTHER): Payer: 59 | Admitting: Mental Health

## 2019-10-30 DIAGNOSIS — F3342 Major depressive disorder, recurrent, in full remission: Secondary | ICD-10-CM

## 2019-10-30 DIAGNOSIS — F411 Generalized anxiety disorder: Secondary | ICD-10-CM | POA: Diagnosis not present

## 2019-10-30 NOTE — Progress Notes (Signed)
[facility] Counselor/Therapist Progress Note   Patient ID: Michaela Pena, MRN: [id number]  Date: 10/30/19  Timespent:  53 minutes   Treatment Type: Individual therapy  Mental Status Exam:   Appearance:   casual  Behavior:  wnl  Motor:  wnl  Speech/Language:   Clear and Coherent  Affect:   Full range  Mood:  anxious, pleasant  Thought process:  Coherent and Relevant, goal directed  Thought content:    WDL, no SI/HI  Perceptual disturbances:    none  Orientation:  Full (Time, Place, and Person)  Attention:  Good  Concentration:  good  Memory:  WNL  Fund of knowledge:   Good  Insight:    Good  Judgment:   Good  Impulse Control:  developing   Reported Symptoms: Daily anxiety, obsessive thoughts, compulsive behavior, panic attacks  Risk Assessment: Danger to Self:  No Self-injurious Behavior: No Danger to Others: No Duty to Warn:no Physical Aggression / Violence:No  Access to Firearms a concern: No  Gang Involvement:No   Patient / guardian was educated about steps to take if suicide or homicide risk level increases between visits. While future psychiatric events cannot be accurately predicted, the patient does not currently require acute inpatient psychiatric care and does not currently meet [facility] involuntary commitment criteria.   Subjective:  Patient presents for session on time. Assessed progress. She continues to struggle with some obsessive compulsive behaviors, primarily related to checking her locks multiple times at night. She said she's been going to sleep later recently due to having more free time prior to her going to work in the afternoon. 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 She said she's made progress with not checking her gas tank, has not checked it in the past several weeks, last incense was when she was driving about 30 minutes away. She also has discontinued walking around her  car checking it prior to driving. She continues to drive without the radio on but does not want to change this behavior "I don't see the point", referring to being able to do this or not. Plan was made where she is to eliminate checking the locks the second time every evening. She and difficulty identifying negative thoughts she may have in the situations with which she would avoid checking locks we encourage her to document them if they occur over the next week when she's making the attempts to avoid the behavior.  Interventions: CBT, Solution Focused and Strength-based  Diagnosis:    ICD-10-CM   1. Recurrent major depressive disorder, in full remission (HCC)  F33.42   2. Generalized anxiety disorder  F41.1      Plan: Patient is to use CBT and coping skills to help manage her daily anxiety and obsessive compulsive behaviors.  Patient to continue to work on decreasing her compulsive behaviors, utilizing cognitive and behavioral skills.  Patient to continue to utilize coping skills discussed in session to decrease obsessive compulsive behaviors.  long-term goal:   Reduce overall level, frequency, and intensity of the feelings of anxiety and obsessive-compulsive behaviors so that daily functioning is not impaired.  Short-term goal:  Verbalize an understanding of how thoughts, physical feelings, and behavioral actions contribute to anxiety and its treatment. Verbalize an understanding of the role that fearful thinking plays in creating fears, excessive worry, and anxiety / panic.\ Reduce compulsive behaviors by utilizing coping skills discussed. Patient to continue take medications as prescribed and report  any concerns to prescribing provider.  Assessment of progress:  progressing .  Waldron Session, [facility]

## 2019-11-03 ENCOUNTER — Other Ambulatory Visit: Payer: Self-pay

## 2019-11-03 ENCOUNTER — Telehealth: Payer: Self-pay | Admitting: Psychiatry

## 2019-11-03 DIAGNOSIS — F900 Attention-deficit hyperactivity disorder, predominantly inattentive type: Secondary | ICD-10-CM

## 2019-11-03 NOTE — Telephone Encounter (Signed)
Last refill 09/12/19 Pended for Dr. Jennelle Human to review while Shanda Bumps out of town.  There is a Nov. Rx pended

## 2019-11-03 NOTE — Telephone Encounter (Signed)
This is pended for December.  Is this intentional?

## 2019-11-03 NOTE — Telephone Encounter (Signed)
Pt would like a rx for Evekeo to be sent in at [facility] at [address]

## 2019-11-05 NOTE — Telephone Encounter (Signed)
Date updated it was for this month

## 2019-11-06 MED ORDER — AMPHETAMINE SULFATE 10 MG PO TABS: 10.0000 mg | ORAL_TABLET | Freq: Every morning | ORAL | 0 refills | Status: DC

## 2019-11-13 ENCOUNTER — Ambulatory Visit (INDEPENDENT_AMBULATORY_CARE_PROVIDER_SITE_OTHER): Payer: 59 | Admitting: Mental Health

## 2019-11-13 ENCOUNTER — Other Ambulatory Visit: Payer: Self-pay

## 2019-11-13 DIAGNOSIS — F3342 Major depressive disorder, recurrent, in full remission: Secondary | ICD-10-CM

## 2019-11-13 NOTE — Progress Notes (Signed)
[facility] Counselor/Therapist Progress Note   Patient ID: Michaela Pena, MRN: [id number]  Date: 11/13/19  Timespent:  53 minutes   Treatment Type: Individual therapy  Mental Status Exam:   Appearance:   casual  Behavior:  wnl  Motor:  wnl  Speech/Language:   Clear and Coherent  Affect:   Full range  Mood:  anxious, pleasant  Thought process:  Coherent and Relevant, goal directed  Thought content:    WDL, no SI/HI  Perceptual disturbances:    none  Orientation:  Full (Time, Place, and Person)  Attention:  Good  Concentration:  good  Memory:  WNL  Fund of knowledge:   Good  Insight:    Good  Judgment:   Good  Impulse Control:  developing   Reported Symptoms: Daily anxiety, obsessive thoughts, compulsive behavior, panic attacks  Risk Assessment: Danger to Self:  No Self-injurious Behavior: No Danger to Others: No Duty to Warn:no Physical Aggression / Violence:No  Access to Firearms a concern: No  Gang Involvement:No   Patient / guardian was educated about steps to take if suicide or homicide risk level increases between visits. While future psychiatric events cannot be accurately predicted, the patient does not currently require acute inpatient psychiatric care and does not currently meet [facility] involuntary commitment criteria.   Subjective:  Patient presents for session on time.  She shared how she is getting some hours back at her old job and continues to [employer] as well.  She continues to avoid having discussions with her mother about going to seminary in the spring, states she wants to avoid the discussion until the last possible time.  She continues to express not wanting to go to seminary.  Continues to cope with anxiety, has made progress with not having to pull over to check her gas cap.  Through guided discovery, she shared how she has a tendency with "germs" as she put it.  She shared how she months her house at least 2 times a day, she may  hear people walking upstairs when she is downstairs in her room and feels compelled to go up and about, knowing that they have walked around with shoes in the house.  This is one of her "rules" with which she stated she tries to remind everyone to be mindful of.  Facilitated her identifying thoughts related to her rationale for some of the discussed behaviors, as well as other thoughts that may be more realistic.  She acknowledged she may not functioning at times when it is not necessarily needed, appearing that this is more of a compulsive behavior.  We discussed ways to start taking more time to evaluate her thoughts with some of these behaviors which have taken place rather automatically over a long period of time as she stated they have been present for years.  Interventions: CBT, Solution Focused and Strength-based  Diagnosis:    ICD-10-CM   1. Recurrent major depressive disorder, in full remission [facility])  F33.42      Plan: Patient is to use CBT and coping skills to help manage her daily anxiety and obsessive compulsive behaviors.  Patient to continue to work on decreasing her compulsive behaviors, utilizing cognitive and behavioral skills.  Patient to continue to utilize coping skills discussed in session to decrease obsessive compulsive behaviors.  long-term goal:   Reduce overall level, frequency, and intensity of the feelings of anxiety and obsessive-compulsive behaviors so that daily functioning is not impaired.  Short-term goal:  Verbalize an understanding of how thoughts, physical feelings, and behavioral actions contribute to anxiety and its treatment. Verbalize an understanding of the role that fearful thinking plays in creating fears, excessive worry, and anxiety / panic.\ Reduce compulsive behaviors by utilizing coping skills discussed. Patient to continue take medications as prescribed and report any concerns to prescribing provider.  Assessment of progress:   progressing .  Waldron Session, [facility]

## 2019-11-18 ENCOUNTER — Other Ambulatory Visit: Payer: Self-pay | Admitting: Psychiatry

## 2019-11-18 DIAGNOSIS — F429 Obsessive-compulsive disorder, unspecified: Secondary | ICD-10-CM

## 2019-11-18 DIAGNOSIS — F411 Generalized anxiety disorder: Secondary | ICD-10-CM

## 2019-11-18 DIAGNOSIS — F3342 Major depressive disorder, recurrent, in full remission: Secondary | ICD-10-CM

## 2019-11-27 ENCOUNTER — Ambulatory Visit (INDEPENDENT_AMBULATORY_CARE_PROVIDER_SITE_OTHER): Payer: 59 | Admitting: Mental Health

## 2019-11-27 ENCOUNTER — Other Ambulatory Visit: Payer: Self-pay

## 2019-11-27 DIAGNOSIS — F3342 Major depressive disorder, recurrent, in full remission: Secondary | ICD-10-CM

## 2019-11-27 NOTE — Progress Notes (Signed)
[facility] Counselor/Therapist Progress Note   Patient ID: Michaela Pena, MRN: [id number]  Date: 11/27/19  Timespent:  53 minutes   Treatment Type: Individual therapy  Mental Status Exam:   Appearance:   casual  Behavior:  wnl  Motor:  wnl  Speech/Language:   Clear and Coherent  Affect:   Full range  Mood:  anxious, pleasant  Thought process:  Coherent and Relevant, goal directed  Thought content:    WDL, no SI/HI  Perceptual disturbances:    none  Orientation:  Full (Time, Place, and Person)  Attention:  Good  Concentration:  good  Memory:  WNL  Fund of knowledge:   Good  Insight:    Good  Judgment:   Good  Impulse Control:  developing   Reported Symptoms: Daily anxiety, obsessive thoughts, compulsive behavior, panic attacks  Risk Assessment: Danger to Self:  No Self-injurious Behavior: No Danger to Others: No Duty to Warn:no Physical Aggression / Violence:No  Access to Firearms a concern: No  Gang Involvement:No   Patient / guardian was educated about steps to take if suicide or homicide risk level increases between visits. While future psychiatric events cannot be accurately predicted, the patient does not currently require acute inpatient psychiatric care and does not currently meet [facility] involuntary commitment criteria.   Subjective:  Patient presents for session on time.  Assessed progress.  She stated that she continues to work 2 jobs and has been busier over the past 2 weeks which she is comfortable with as she is currently not in school this semester.  She denied that she and her mother have had any further discussions about her going to seminary and is real in the spring of next year.  She continues to endorse not wanting to attend seminary at this point.  The situation is an ongoing level of stress for patient and continues to identify wanting to not have this discussion with her mother again at this point and wait until sometime next  year.  She further disclosed other areas with which she exhibits some obsessive-compulsive behaviors.  She provide more detail regarding how often she mops in the home, which is anywhere from 2 times or more per day.  She shared how she has difficulty not mopping particularly when she knows someone is walked inside the house with she is on.  She also further disclosed wiping handles to doors and cabinets throughout the house to decrease germ contact, which also may occur multiple times per day.  Through guided discovery, she identified that "ideally" she would like to decrease some of these behaviors.  Facilitating her further identifying reasons where she eventually identified some of her behaviors as "not needed" while also identifying some of them being needed in terms of cleanliness, rather not to the excess with which she engages in the behaviors redundantly.  Plan is for her to avoid 1 of these behaviors over the next few days and we explored ways to cope in these moments, thought stopping distraction avoidance.  Interventions: CBT, Solution Focused and Strength-based  Diagnosis:    ICD-10-CM   1. Recurrent major depressive disorder, in full remission [facility])  F33.42      Plan: Patient is to use CBT and coping skills to help manage her daily anxiety and obsessive compulsive behaviors.  Patient to continue to work on decreasing her compulsive behaviors, utilizing cognitive and behavioral skills.  Patient to continue to utilize coping skills discussed in session to decrease obsessive  compulsive behaviors.  long-term goal:   Reduce overall level, frequency, and intensity of the feelings of anxiety and obsessive-compulsive behaviors so that daily functioning is not impaired.  Short-term goal:  Verbalize an understanding of how thoughts, physical feelings, and behavioral actions contribute to anxiety and its treatment. Verbalize an understanding of the role that fearful thinking plays in creating  fears, excessive worry, and anxiety / panic.\ Reduce compulsive behaviors by utilizing coping skills discussed. Patient to continue take medications as prescribed and report any concerns to prescribing provider.  Assessment of progress:  progressing .  Waldron Session, [facility]

## 2019-12-04 ENCOUNTER — Ambulatory Visit: Payer: 59 | Admitting: Psychiatry

## 2019-12-11 ENCOUNTER — Other Ambulatory Visit: Payer: Self-pay

## 2019-12-11 ENCOUNTER — Ambulatory Visit (INDEPENDENT_AMBULATORY_CARE_PROVIDER_SITE_OTHER): Payer: 59 | Admitting: Mental Health

## 2019-12-11 DIAGNOSIS — F411 Generalized anxiety disorder: Secondary | ICD-10-CM

## 2019-12-11 DIAGNOSIS — F429 Obsessive-compulsive disorder, unspecified: Secondary | ICD-10-CM | POA: Diagnosis not present

## 2019-12-11 NOTE — Progress Notes (Signed)
     [facility] Counselor/Therapist Progress Note   Patient ID: Michaela Pena, MRN: [id number]  Date: 12/11/19  Timespent:  55 minutes   Treatment Type: Individual therapy  Mental Status Exam:   Appearance:   casual  Behavior:  wnl  Motor:  wnl  Speech/Language:   Clear and Coherent  Affect:   Full range  Mood:  anxious, pleasant  Thought process:  Coherent and Relevant, goal directed  Thought content:    WDL, no SI/HI  Perceptual disturbances:    none  Orientation:  Full (Time, Place, and Person)  Attention:  Good  Concentration:  good  Memory:  WNL  Fund of knowledge:   Good  Insight:    Good  Judgment:   Good  Impulse Control:  developing   Reported Symptoms: Daily anxiety, obsessive thoughts, compulsive behavior, panic attacks  Risk Assessment: Danger to Self:  No Self-injurious Behavior: No Danger to Others: No Duty to Warn:no Physical Aggression / Violence:No  Access to Firearms a concern: No  Gang Involvement:No   Patient / guardian was educated about steps to take if suicide or homicide risk level increases between visits. While future psychiatric events cannot be accurately predicted, the patient does not currently require acute inpatient psychiatric care and does not currently meet [facility] involuntary commitment criteria.   Subjective:  Patient presents for session on time.  Patient shared recent progress, events where she said that she has been feeling more pressure from her mother regarding attending seminary next year. Challenge share details continues to try and avoid engaging in these discussions while also trying to set some boundaries. Through guided discovery, she identified the need to potentially be ready to have the discussion with her mother sooner than she would like due to her increased persistence recently. Patient continues to maintain that she does not want to attend seminary. Assist her in identifying and processing some  feelings related. She continues to struggle with some obsessive behaviors as discussed in previous sessions continues to make minimal progress since last session. She continues to endorse wanting to make some changes and expressed intentions to follow through with plans discussed last session.  Interventions: CBT, Solution Focused and Strength-based  Diagnosis:    ICD-10-CM   1. Generalized anxiety disorder  F41.1   2. Obsessive-compulsive disorder, unspecified type  F42.9      Plan: Patient is to use CBT and coping skills to help manage her daily anxiety and obsessive compulsive behaviors.  Patient to continue to work on decreasing her compulsive behaviors, utilizing cognitive and behavioral skills.  Patient to continue to utilize coping skills discussed in session to decrease obsessive compulsive behaviors.  long-term goal:   Reduce overall level, frequency, and intensity of the feelings of anxiety and obsessive-compulsive behaviors so that daily functioning is not impaired.  Short-term goal:  Verbalize an understanding of how thoughts, physical feelings, and behavioral actions contribute to anxiety and its treatment. Verbalize an understanding of the role that fearful thinking plays in creating fears, excessive worry, and anxiety / panic.\ Reduce compulsive behaviors by utilizing coping skills discussed. Patient to continue take medications as prescribed and report any concerns to prescribing provider.  Assessment of progress:  progressing .  Waldron Session, [facility]

## 2019-12-19 ENCOUNTER — Other Ambulatory Visit: Payer: Self-pay | Admitting: Psychiatry

## 2019-12-19 DIAGNOSIS — F3342 Major depressive disorder, recurrent, in full remission: Secondary | ICD-10-CM

## 2019-12-24 IMAGING — US US AXILLARY RIGHT
1 series · 3 of 3 positions shown · non-contrast
Comparison: None.

CLINICAL DATA: Patient reports a palpable lump in the right axilla.
This lump changes in size with patient's menstrual cycle there is
associated cyclical pain.

EXAM:
ULTRASOUND OF THE RIGHT AXILLA

[Series 1: us axillary right · 0.06mm/px · 3 of 3 slices shown]
[im 1/3]
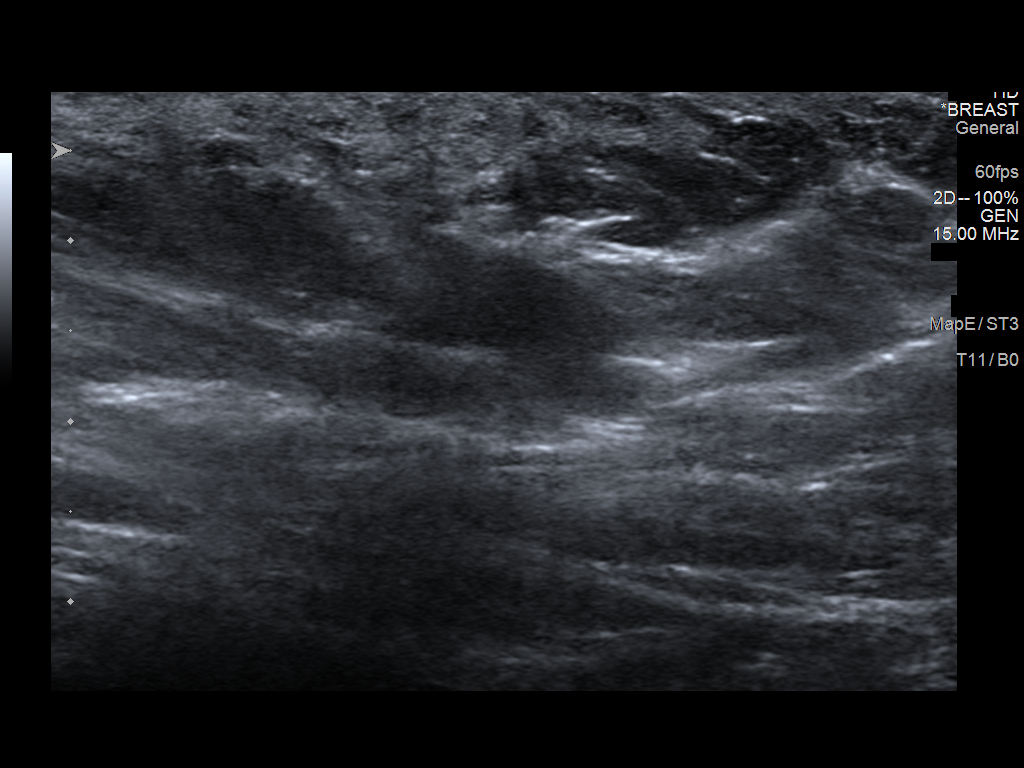
[im 2/3]
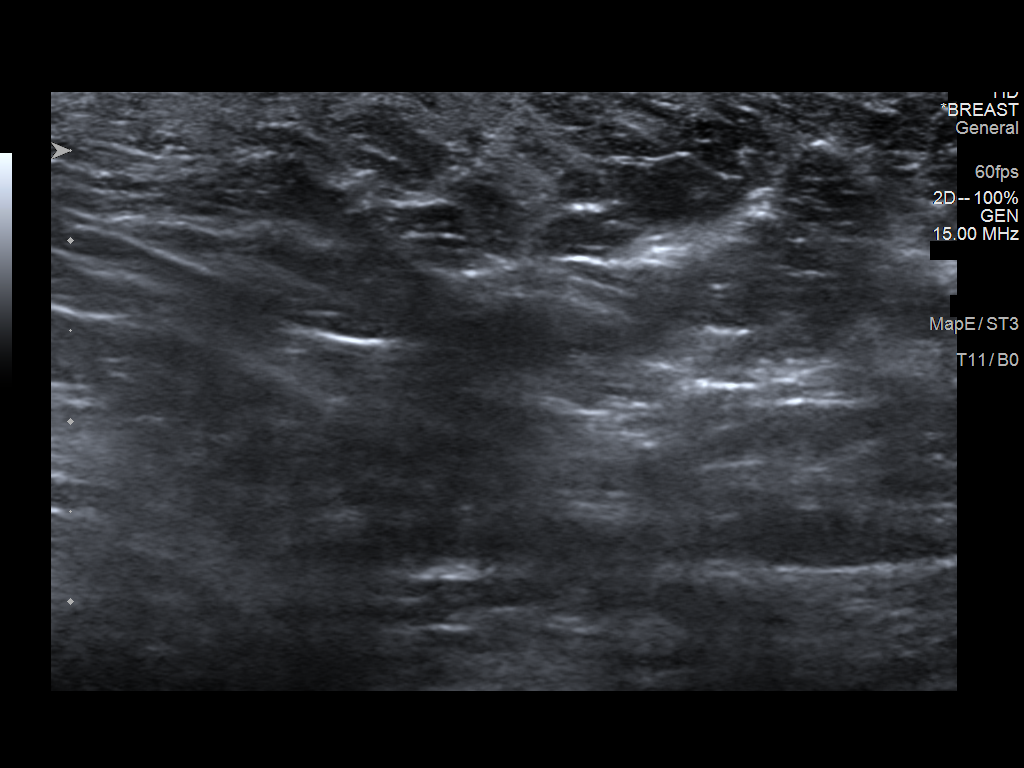
[im 3/3]
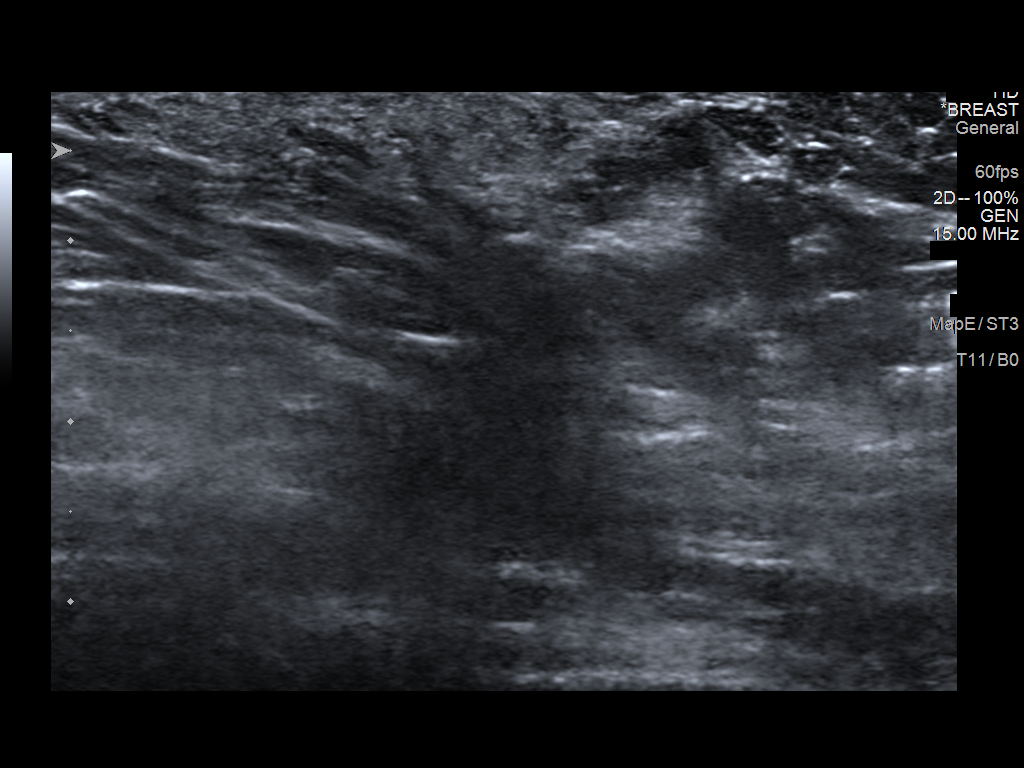

[3 of 3 positions shown; findings below may reference images not displayed]

FINDINGS: On physical exam,there is a soft prominence in the right axilla. No
defined mass.

Ultrasound is performed, showing heterogeneous tissue, consistent
with ectopic axillary fibroglandular tissue, along the superficial
aspect of the right axilla corresponding to the palpable
abnormality. There is no defined mass. There are no enlarged or
abnormal lymph nodes.
IMPRESSION: 1. No evidence of malignancy.
2. Ectopic fibroglandular tissue in the right axilla accounting for
the palpable abnormality.

RECOMMENDATION:
Screening mammography beginning at age 31. This is based on her
family history, mother diagnosed with triple negative breast
carcinoma at age 39.

I have discussed the findings and recommendations with the patient.
Results were also provided in writing at the conclusion of the
visit. If applicable, a reminder letter will be sent to the patient
regarding the next appointment.

BI-RADS CATEGORY  2: Benign.

## 2019-12-27 ENCOUNTER — Other Ambulatory Visit: Payer: Self-pay

## 2019-12-27 ENCOUNTER — Ambulatory Visit (INDEPENDENT_AMBULATORY_CARE_PROVIDER_SITE_OTHER): Payer: 59 | Admitting: Mental Health

## 2019-12-27 DIAGNOSIS — F429 Obsessive-compulsive disorder, unspecified: Secondary | ICD-10-CM | POA: Diagnosis not present

## 2019-12-27 DIAGNOSIS — F411 Generalized anxiety disorder: Secondary | ICD-10-CM | POA: Diagnosis not present

## 2019-12-27 NOTE — Progress Notes (Signed)
[facility] Counselor/Therapist Progress Note   Patient ID: Michaela Pena, MRN: [id number]  Date: 12/27/19  Timespent:   52 minutes   Treatment Type: Individual therapy  Mental Status Exam:   Appearance:   casual  Behavior:  wnl  Motor:  wnl  Speech/Language:   Clear and Coherent  Affect:   Full range  Mood:  anxious, pleasant  Thought process:  Coherent and Relevant, goal directed  Thought content:    WDL, no SI/HI  Perceptual disturbances:    none  Orientation:  Full (Time, Place, and Person)  Attention:  Good  Concentration:  good  Memory:  WNL  Fund of knowledge:   Good  Insight:    Good  Judgment:   Good  Impulse Control:  developing   Reported Symptoms: Daily anxiety, obsessive thoughts, compulsive behavior, panic attacks  Risk Assessment: Danger to Self:  No Self-injurious Behavior: No Danger to Others: No Duty to Warn:no Physical Aggression / Violence:No  Access to Firearms a concern: No  Gang Involvement:No   Patient / guardian was educated about steps to take if suicide or homicide risk level increases between visits. While future psychiatric events cannot be accurately predicted, the patient does not currently require acute inpatient psychiatric care and does not currently meet [facility] involuntary commitment criteria.   Subjective:  Patient presents for session on time.  Assessed progress where she stated that she has noticed her mom trying to engage her about attending seminary at least once per week making some type of suggestion indirectly.  Patient identifies some concern about her mother eventually being more direct, wanting to have a more pointed discussion with her about attending seminary as she knows her mother is aware of her reluctance.  Patient continues to maintain not wanting to attend seminary.  Collaboratively, we explore ways to communicate her feelings and the potential benefits of approaching her mother at some point to  have the discussion.  She continues to endorse thoughts and feelings related to some obsessions and compulsions related to germs.  She stated that she has mops less in the house, largely due to her being away from home, working more often providing some childcare.  She continues also work her other job which keeps her busy throughout the week.  Through guided discovery, she identified how her behaviors related to cleanliness and germs are outcomes which are related to her anxiety.  She considers her basement room her "safe space".  Continue to explore and assist her in identifying thoughts related, with a focus on evaluating thoughts that are realistic and may assist her in changing some behaviors identified as well as other thoughts that reinforce this cycle.  Interventions: CBT, Solution Focused and Strength-based  Diagnosis:    ICD-10-CM   1. Obsessive-compulsive disorder, unspecified type  F42.9   2. Generalized anxiety disorder  F41.1      Plan: Patient is to use CBT and coping skills to help manage her daily anxiety and obsessive compulsive behaviors.  Patient to continue to work on decreasing her compulsive behaviors, utilizing cognitive and behavioral skills.  Patient to continue to utilize coping skills discussed in session to decrease obsessive compulsive behaviors.  long-term goal:   Reduce overall level, frequency, and intensity of the feelings of anxiety and obsessive-compulsive behaviors so that daily functioning is not impaired.  Short-term goal:  Verbalize an understanding of how thoughts, physical feelings, and behavioral actions contribute to anxiety and its treatment. Verbalize an understanding of the  role that fearful thinking plays in creating fears, excessive worry, and anxiety / panic.\ Reduce compulsive behaviors by utilizing coping skills discussed. Patient to continue take medications as prescribed and report any concerns to prescribing provider.  Assessment of  progress:  progressing .  Waldron Session, [facility]

## 2020-01-09 ENCOUNTER — Ambulatory Visit (INDEPENDENT_AMBULATORY_CARE_PROVIDER_SITE_OTHER): Payer: 59 | Admitting: Mental Health

## 2020-01-09 ENCOUNTER — Other Ambulatory Visit: Payer: Self-pay

## 2020-01-09 DIAGNOSIS — F429 Obsessive-compulsive disorder, unspecified: Secondary | ICD-10-CM | POA: Diagnosis not present

## 2020-01-09 NOTE — Progress Notes (Signed)
°     [facility] Counselor/Therapist Progress Note   Patient ID: Malyna Budney, MRN: [id number]  Date: 01/09/20  Timespent:   53 minutes   Treatment Type: Individual therapy  Mental Status Exam:   Appearance:   casual  Behavior:  wnl  Motor:  wnl  Speech/Language:   Clear and Coherent  Affect:   Full range  Mood:  anxious, pleasant  Thought process:  Coherent and Relevant, goal directed  Thought content:    WDL, no SI/HI  Perceptual disturbances:    none  Orientation:  Full (Time, Place, and Person)  Attention:  Good  Concentration:  good  Memory:  WNL  Fund of knowledge:   Good  Insight:    Good  Judgment:   Good  Impulse Control:  developing   Reported Symptoms: Daily anxiety, obsessive thoughts, compulsive behavior, panic attacks  Risk Assessment: Danger to Self:  No Self-injurious Behavior: No Danger to Others: No Duty to Warn:no Physical Aggression / Violence:No  Access to Firearms a concern: No  Gang Involvement:No   Patient / guardian was educated about steps to take if suicide or homicide risk level increases between visits. While future psychiatric events cannot be accurately predicted, the patient does not currently require acute inpatient psychiatric care and does not currently meet [facility] involuntary commitment criteria.   Subjective:  Patient presents for session on time.  She focused today on the relationship with her mother as she continues to get pressure about attending seminary next month.  She stated that her mother recently told her that she needed to have a talk with her after the new year.  Patient speculates this is about her attending seminary.  Patient continues to maintain not wanting to attend seminary, how she and her mother continue to disagree.  Through guided discovery, she identified not wanting to have this conversation with her mother due to their conflicting views.  Patient shared how her not going to seminary will  possibly cause a rift between her and her extended family members as well.  Ultimately patient identified living out of the home and getting around place will ultimately allow her to obtain more independence and allow her also to make more of her own decisions without her mother's influence.  Some ways to communicate with her mother effectively sharing her thoughts and feelings were explored throughout the session.  Interventions: CBT, Solution Focused and Strength-based  Diagnosis:    ICD-10-CM   1. Obsessive-compulsive disorder, unspecified type  F42.9      Plan: Patient is to use CBT and coping skills to help manage her daily anxiety and obsessive compulsive behaviors.  Patient to continue to work on decreasing her compulsive behaviors, utilizing cognitive and behavioral skills.  Patient to continue to utilize coping skills discussed in session to decrease obsessive compulsive behaviors.  long-term goal:   Reduce overall level, frequency, and intensity of the feelings of anxiety and obsessive-compulsive behaviors so that daily functioning is not impaired.  Short-term goal:  Verbalize an understanding of how thoughts, physical feelings, and behavioral actions contribute to anxiety and its treatment. Verbalize an understanding of the role that fearful thinking plays in creating fears, excessive worry, and anxiety / panic.\ Reduce compulsive behaviors by utilizing coping skills discussed. Patient to continue take medications as prescribed and report any concerns to prescribing provider.  Assessment of progress:  progressing .  Waldron Session, [facility]

## 2020-01-22 ENCOUNTER — Other Ambulatory Visit: Payer: Self-pay

## 2020-01-22 ENCOUNTER — Ambulatory Visit (INDEPENDENT_AMBULATORY_CARE_PROVIDER_SITE_OTHER): Payer: 59 | Admitting: Mental Health

## 2020-01-22 DIAGNOSIS — F429 Obsessive-compulsive disorder, unspecified: Secondary | ICD-10-CM | POA: Diagnosis not present

## 2020-01-22 NOTE — Progress Notes (Signed)
[facility] Counselor/Therapist Progress Note   Patient ID: Michaela Pena, MRN: [id number]  Date: 02/22/20  Timespent:   53 minutes   Treatment Type: Individual therapy  Mental Status Exam:   Appearance:   casual  Behavior:  wnl  Motor:  wnl  Speech/Language:   Clear and Coherent  Affect:   Full range  Mood:  anxious, pleasant  Thought process:  Coherent and Relevant, goal directed  Thought content:    WDL, no SI/HI  Perceptual disturbances:    none  Orientation:  Full (Time, Place, and Person)  Attention:  Good  Concentration:  good  Memory:  WNL  Fund of knowledge:   Good  Insight:    Good  Judgment:   Good  Impulse Control:  developing   Reported Symptoms: Daily anxiety, obsessive thoughts, compulsive behavior, panic attacks  Risk Assessment: Danger to Self:  No Self-injurious Behavior: No Danger to Others: No Duty to Warn:no Physical Aggression / Violence:No  Access to Firearms a concern: No  Gang Involvement:No   Patient / guardian was educated about steps to take if suicide or homicide risk level increases between visits. While future psychiatric events cannot be accurately predicted, the patient does not currently require acute inpatient psychiatric care and does not currently meet [facility] involuntary commitment criteria.   Subjective:  Patient presents for session on time.  She shared progress, changes.  She stated that her mother tried to talk to her about going to seminary recently, but the discussion was postponed.  Patient stated that she will probably want to talk to her in the next week or so.  Patient continues to state that she does not want to attend seminary, plans on telling her mother so and holding her ground as her mother strongly wants her to attend.  Some ways to communicate to her mother her feelings were discussed collaboratively.  She continues to struggle with some day-to-day obsessive-compulsive behaviors, excessive  handwashing, when at home mopping a few times a day.  She stated she has to take a shower after she mopped, averages somewhere between 2-4 showers a day, stated her skin can get dry as a result.  Expresses motivation to change, however, some reluctance as many of these behaviors are having at this point.  She shared thoughts supportive of justifying some of the obsessions while also able to confront some of them verbally in session.  She admitted that some of her thoughts around germs were not logical.  We continue to discuss picking more or to compulsions and systematically decreasing one of the behaviors per day as a starting point to facilitate change.   Interventions: CBT, Solution Focused and Strength-based  Diagnosis:    ICD-10-CM   1. Obsessive-compulsive disorder, unspecified type  F42.9      Plan: Patient is to use CBT and coping skills to help manage her daily anxiety and obsessive compulsive behaviors.  Patient to continue to work on decreasing her compulsive behaviors, utilizing cognitive and behavioral skills.  Patient to continue to utilize coping skills discussed in session to decrease obsessive compulsive behaviors.  long-term goal:   Reduce overall level, frequency, and intensity of the feelings of anxiety and obsessive-compulsive behaviors so that daily functioning is not impaired.  Short-term goal:  Verbalize an understanding of how thoughts, physical feelings, and behavioral actions contribute to anxiety and its treatment. Verbalize an understanding of the role that fearful thinking plays in creating fears, excessive worry, and anxiety / panic.\ Reduce  compulsive behaviors by utilizing coping skills discussed. Patient to continue take medications as prescribed and report any concerns to prescribing provider.  Assessment of progress:  progressing .  Waldron Session, [facility]

## 2020-01-25 ENCOUNTER — Other Ambulatory Visit: Payer: 59

## 2020-01-25 ENCOUNTER — Other Ambulatory Visit: Payer: Self-pay

## 2020-01-25 DIAGNOSIS — Z20822 Contact with and (suspected) exposure to covid-19: Secondary | ICD-10-CM

## 2020-01-28 LAB — SARS-COV-2, NAA 2 DAY TAT

## 2020-01-28 LAB — NOVEL CORONAVIRUS, NAA: SARS-CoV-2, NAA: NOT DETECTED

## 2020-02-05 ENCOUNTER — Ambulatory Visit (INDEPENDENT_AMBULATORY_CARE_PROVIDER_SITE_OTHER): Payer: 59 | Admitting: Mental Health

## 2020-02-05 ENCOUNTER — Other Ambulatory Visit: Payer: Self-pay

## 2020-02-05 DIAGNOSIS — F429 Obsessive-compulsive disorder, unspecified: Secondary | ICD-10-CM | POA: Diagnosis not present

## 2020-02-05 NOTE — Progress Notes (Signed)
     [facility] Counselor/Therapist Progress Note   Patient ID: Michaela Pena, MRN: [id number]  Date: 02/05/20  Timespent:   53 minutes   Treatment Type: Individual therapy  Mental Status Exam:   Appearance:   casual  Behavior:  wnl  Motor:  wnl  Speech/Language:   Clear and Coherent  Affect:   Full range  Mood:  anxious, pleasant  Thought process:  Coherent and Relevant, goal directed  Thought content:    WDL, no SI/HI  Perceptual disturbances:    none  Orientation:  Full (Time, Place, and Person)  Attention:  Good  Concentration:  good  Memory:  WNL  Fund of knowledge:   Good  Insight:    Good  Judgment:   Good  Impulse Control:  developing   Reported Symptoms: Daily anxiety, obsessive thoughts, compulsive behavior, panic attacks  Risk Assessment: Danger to Self:  No Self-injurious Behavior: No Danger to Others: No Duty to Warn:no Physical Aggression / Violence:No  Access to Firearms a concern: No  Gang Involvement:No   Patient / guardian was educated about steps to take if suicide or homicide risk level increases between visits. While future psychiatric events cannot be accurately predicted, the patient does not currently require acute inpatient psychiatric care and does not currently meet [facility] involuntary commitment criteria.   Subjective:  Patient presents for session on time.  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  She shared how her mother is not spoken to her about the seminary trip which is to take place in March.  Most of the session was centered around concerns about how to deal with the situation as patient maintains she does not want to attend seminary while also feeling stressed with how this decision will affect her mother and how she is viewed from other extended family members.  Provide support and understanding throughout engaging in some problem solving with a focus on  how to communicate her needs with her mother.  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  Interventions: CBT, Solution Focused and Strength-based  Diagnosis:    ICD-10-CM   1. Obsessive-compulsive disorder, unspecified type  F42.9      Plan: Patient is to use CBT and coping skills to help manage her daily anxiety and obsessive compulsive behaviors.  Patient to continue to work on decreasing her compulsive behaviors, utilizing cognitive and behavioral skills.  Patient to continue to utilize coping skills discussed in session to decrease obsessive compulsive behaviors.  long-term goal:   Reduce overall level, frequency, and intensity of the feelings of anxiety and obsessive-compulsive behaviors so that daily functioning is not impaired.  Short-term goal:  Verbalize an understanding of how thoughts, physical feelings, and behavioral actions contribute to anxiety and its treatment. Verbalize an understanding of the role that fearful thinking plays in creating fears, excessive worry, and anxiety / panic.\ Reduce compulsive behaviors by utilizing coping skills discussed. Patient to continue take medications as prescribed and report any concerns to prescribing provider.  Assessment of progress:  progressing .  Waldron Session, [facility]

## 2020-02-19 ENCOUNTER — Other Ambulatory Visit: Payer: Self-pay

## 2020-02-19 ENCOUNTER — Ambulatory Visit (INDEPENDENT_AMBULATORY_CARE_PROVIDER_SITE_OTHER): Payer: 59 | Admitting: Mental Health

## 2020-02-19 DIAGNOSIS — F429 Obsessive-compulsive disorder, unspecified: Secondary | ICD-10-CM

## 2020-02-19 NOTE — Progress Notes (Signed)
     [facility] Counselor/Therapist Progress Note   Patient ID: Michaela Pena, MRN: [id number]  Date: 02/19/20   Timespent:   55 minutes   Treatment Type: Individual therapy  Mental Status Exam:   Appearance:   casual  Behavior:  wnl  Motor:  wnl  Speech/Language:   Clear and Coherent  Affect:   Full range  Mood:  anxious, pleasant  Thought process:  Coherent and Relevant, goal directed  Thought content:    WDL, no SI/HI  Perceptual disturbances:    none  Orientation:  Full (Time, Place, and Person)  Attention:  Good  Concentration:  good  Memory:  WNL  Fund of knowledge:   Good  Insight:    Good  Judgment:   Good  Impulse Control:  developing   Reported Symptoms: Daily anxiety, obsessive thoughts, compulsive behavior, panic attacks  Risk Assessment: Danger to Self:  No Self-injurious Behavior: No Danger to Others: No Duty to Warn:no Physical Aggression / Violence:No  Access to Firearms a concern: No  Gang Involvement:No   Patient / guardian was educated about steps to take if suicide or homicide risk level increases between visits. While future psychiatric events cannot be accurately predicted, the patient does not currently require acute inpatient psychiatric care and does not currently meet [facility] involuntary commitment criteria.   Subjective:  Patient presents for session on time.  She shared how her mother is not approached her about attending seminary next month.  She stated that there is a plane ticket that her mother purchased and assumed that her mother would have had a discussion with her about going.  Patient maintains that she does not plan to go, continues to plan to talk to her mother a couple days prior to the planned departure.  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  She stated that she misses college, likes that routine and only has 1 semester left.  Collaboratively, we continue to  explore ways to communicate with her mother when that discussion occurred regarding her not wanting to attend seminary.  Patient also identified uncertainty about her job future, what career to choose and is considering graduate school.  She plans to follow through with plan to review potential job she might want to take classes toward.  Interventions: CBT, Solution Focused and Strength-based  Diagnosis:    ICD-10-CM   1. Obsessive-compulsive disorder, unspecified type  F42.9      Plan: Patient is to use CBT and coping skills to help manage her daily anxiety and obsessive compulsive behaviors.  Patient to continue to work on decreasing her compulsive behaviors, utilizing cognitive and behavioral skills.  Patient to continue to utilize coping skills discussed in session to decrease obsessive compulsive behaviors.  long-term goal:   Reduce overall level, frequency, and intensity of the feelings of anxiety and obsessive-compulsive behaviors so that daily functioning is not impaired.  Short-term goal:  Verbalize an understanding of how thoughts, physical feelings, and behavioral actions contribute to anxiety and its treatment. Verbalize an understanding of the role that fearful thinking plays in creating fears, excessive worry, and anxiety / panic.\ Reduce compulsive behaviors by utilizing coping skills discussed. Patient to continue take medications as prescribed and report any concerns to prescribing provider.  Assessment of progress:  progressing .  Waldron Session, [facility]

## 2020-03-04 ENCOUNTER — Other Ambulatory Visit: Payer: Self-pay

## 2020-03-04 ENCOUNTER — Ambulatory Visit (INDEPENDENT_AMBULATORY_CARE_PROVIDER_SITE_OTHER): Payer: 59 | Admitting: Mental Health

## 2020-03-04 DIAGNOSIS — F429 Obsessive-compulsive disorder, unspecified: Secondary | ICD-10-CM

## 2020-03-04 NOTE — Progress Notes (Signed)
[facility] Counselor/Therapist Progress Note   Patient ID: Elgene Coral, MRN: [id number]  Date: 03/04/20   Timespent:   55 minutes   Treatment Type: Individual therapy  Mental Status Exam:   Appearance:   casual  Behavior:  wnl  Motor:  wnl  Speech/Language:   Clear and Coherent  Affect:   Full range  Mood:  anxious, pleasant  Thought process:  Coherent and Relevant, goal directed  Thought content:    WDL, no SI/HI  Perceptual disturbances:    none  Orientation:  Full (Time, Place, and Person)  Attention:  Good  Concentration:  good  Memory:  WNL  Fund of knowledge:   Good  Insight:    Good  Judgment:   Good  Impulse Control:  developing   Reported Symptoms: Daily anxiety, obsessive thoughts, compulsive behavior, panic attacks  Risk Assessment: Danger to Self:  No Self-injurious Behavior: No Danger to Others: No Duty to Warn:no Physical Aggression / Violence:No  Access to Firearms a concern: No  Gang Involvement:No   Patient / guardian was educated about steps to take if suicide or homicide risk level increases between visits. While future psychiatric events cannot be accurately predicted, the patient does not currently require acute inpatient psychiatric care and does not currently meet [facility] involuntary commitment criteria.   Subjective:  Patient presents for session on time.  She shared how her mother has not approached her about attending seminary which is next month.  Patient stated that she was surprised that her mother is not had the discussion with her at this point; patient continues to state she would does not want to attend and plans on letting her mother know when she eventually is approached by her mother with this discussion.   Patient continues to identify her continued challenges with some obsessive-compulsive behaviors related to cleanliness.  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  Through guided discovery, patient identified that she knows this is not rational however, identifies thoughts she has that continue the behavior.  We reviewed systematic desensitization, and applied it to today's shared examples of some of her challenges with OCD.  Plan is for patient to have 1 day out of the week where she comes home, mops the floor and takes a shower after versus taking 1 before and after.  We reviewed the significance of taking steps behaviorally and then using coping mechanisms such as thought blocking to make progress in these areas.    Interventions: CBT, Solution Focused and Strength-based  Diagnosis:    ICD-10-CM   1. Obsessive-compulsive disorder, unspecified type  F42.9      Plan: Patient is to use CBT and coping skills to help manage her daily anxiety and obsessive compulsive behaviors.  Patient to continue to work on decreasing her compulsive behaviors, utilizing cognitive and behavioral skills.  Patient to continue to utilize coping skills discussed in session to decrease obsessive compulsive behaviors.  long-term goal:   Reduce overall level, frequency, and intensity of the feelings of anxiety and obsessive-compulsive behaviors so that daily functioning is not impaired.  Short-term goal:  Verbalize an understanding of how thoughts, physical feelings, and behavioral actions contribute to anxiety and its treatment. Verbalize an understanding of the role that fearful thinking plays in creating fears, excessive worry, and anxiety / panic.\ Reduce compulsive behaviors by utilizing coping  skills discussed. Patient to continue take medications as prescribed and report any concerns to prescribing provider.  Assessment of progress:  progressing .  Waldron Session, [facility]

## 2020-03-18 ENCOUNTER — Ambulatory Visit (INDEPENDENT_AMBULATORY_CARE_PROVIDER_SITE_OTHER): Payer: 59 | Admitting: Mental Health

## 2020-03-18 ENCOUNTER — Other Ambulatory Visit: Payer: Self-pay

## 2020-03-18 DIAGNOSIS — F429 Obsessive-compulsive disorder, unspecified: Secondary | ICD-10-CM | POA: Diagnosis not present

## 2020-03-18 NOTE — Progress Notes (Signed)
     [facility] Counselor/Therapist Progress Note   Patient ID: Kiley Solimine, MRN: [id number]  Date: 03/18/20   Timespent:   55 minutes    Treatment Type: Individual therapy  Mental Status Exam:   Appearance:   casual  Behavior:  wnl  Motor:  wnl  Speech/Language:   Clear and Coherent  Affect:   Full range  Mood:  anxious, pleasant  Thought process:  Coherent and Relevant, goal directed  Thought content:    WDL, no SI/HI  Perceptual disturbances:    none  Orientation:  Full (Time, Place, and Person)  Attention:  Good  Concentration:  good  Memory:  WNL  Fund of knowledge:   Good  Insight:    Good  Judgment:   Good  Impulse Control:  developing   Reported Symptoms: Daily anxiety, obsessive thoughts, compulsive behavior, panic attacks  Risk Assessment: Danger to Self:  No Self-injurious Behavior: No Danger to Others: No Duty to Warn:no Physical Aggression / Violence:No  Access to Firearms a concern: No  Gang Involvement:No   Patient / guardian was educated about steps to take if suicide or homicide risk level increases between visits. While future psychiatric events cannot be accurately predicted, the patient does not currently require acute inpatient psychiatric care and does not currently meet [facility] involuntary commitment criteria.   Subjective:  Patient presents for session on time.  Patient shared recent progress and events.  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  Patient stated she took her up on this offer, as she does not want to go to seminary and the counseling is to be monthly.  We continue to explore her progress regarding some of her obsessive behaviors.  She admitted that she struggles to take steps in this area as discussed last session.  We continue to work from a cognitive behavioral  framework assisting her in identifying calming self talk to utilize some moments where she may try to challenge herself and not follow through with some of these behaviors which are primarily related to cleaning in the house repetitively.    Interventions: CBT, Solution Focused and Strength-based  Diagnosis:    ICD-10-CM   1. Obsessive-compulsive disorder, unspecified type  F42.9      Plan: Patient is to use CBT and coping skills to help manage her daily anxiety and obsessive compulsive behaviors.  Patient to continue to work on decreasing her compulsive behaviors, utilizing cognitive and behavioral skills.  Patient to continue to utilize coping skills discussed in session to decrease obsessive compulsive behaviors.  long-term goal:   Reduce overall level, frequency, and intensity of the feelings of anxiety and obsessive-compulsive behaviors so that daily functioning is not impaired.  Short-term goal:  Verbalize an understanding of how thoughts, physical feelings, and behavioral actions contribute to anxiety and its treatment. Verbalize an understanding of the role that fearful thinking plays in creating fears, excessive worry, and anxiety / panic.\ Reduce compulsive behaviors by utilizing coping skills discussed. Patient to continue take medications as prescribed and report any concerns to prescribing provider.  Assessment of progress:  progressing .  Waldron Session, [facility]

## 2020-04-01 ENCOUNTER — Ambulatory Visit (INDEPENDENT_AMBULATORY_CARE_PROVIDER_SITE_OTHER): Payer: 59 | Admitting: Mental Health

## 2020-04-01 ENCOUNTER — Other Ambulatory Visit: Payer: Self-pay

## 2020-04-01 DIAGNOSIS — F429 Obsessive-compulsive disorder, unspecified: Secondary | ICD-10-CM | POA: Diagnosis not present

## 2020-04-01 NOTE — Progress Notes (Signed)
     [facility] Counselor/Therapist Progress Note   Patient ID: Michaela Pena, MRN: [id number]  Date: 04/01/20   Timespent:   53 minutes    Treatment Type: Individual therapy  Mental Status Exam:   Appearance:   casual  Behavior:  wnl  Motor:  wnl  Speech/Language:   Clear and Coherent  Affect:   Full range  Mood:  anxious, pleasant  Thought process:  Coherent and Relevant, goal directed  Thought content:    WDL, no SI/HI  Perceptual disturbances:    none  Orientation:  Full (Time, Place, and Person)  Attention:  Good  Concentration:  good  Memory:  WNL  Fund of knowledge:   Good  Insight:    Good  Judgment:   Good  Impulse Control:  developing   Reported Symptoms: Daily anxiety, obsessive thoughts, compulsive behavior, panic attacks  Risk Assessment: Danger to Self:  No Self-injurious Behavior: No Danger to Others: No Duty to Warn:no Physical Aggression / Violence:No  Access to Firearms a concern: No  Gang Involvement:No   Patient / guardian was educated about steps to take if suicide or homicide risk level increases between visits. While future psychiatric events cannot be accurately predicted, the patient does not currently require acute inpatient psychiatric care and does not currently meet [facility] involuntary commitment criteria.   Subjective:  Patient presents for session on time.  She shared progress, how she no longer checks the gas On her car during trips.  She stated that she feels that the change in routine over time was one of the main factors in her making progress with eliminating this behavior.  At this point, she is uncertain if she wants to change other behaviors discussed previously in sessions.  She stated that she has not taken steps as discussed recently and working to change some of these behaviors.  She went on to share other issues, where her mother stated that she would like to live in [place] and plans to live there in about 1  year.  Patient went on to share feelings and uncertainty about her being able to complete college if she has to find her own residence.  We discussed talking with her mother at some point soon prior to fall registration to have an understanding and to make plans.   Interventions: CBT, Solution Focused and Strength-based  Diagnosis:    ICD-10-CM   1. Obsessive-compulsive disorder, unspecified type  F42.9      Plan: Patient is to use CBT and coping skills to help manage her daily anxiety and obsessive compulsive behaviors.  Patient to continue to work on decreasing her compulsive behaviors, utilizing cognitive and behavioral skills.  Patient to continue to utilize coping skills discussed in session to decrease obsessive compulsive behaviors.  long-term goal:   Reduce overall level, frequency, and intensity of the feelings of anxiety and obsessive-compulsive behaviors so that daily functioning is not impaired.  Short-term goal:  Verbalize an understanding of how thoughts, physical feelings, and behavioral actions contribute to anxiety and its treatment. Verbalize an understanding of the role that fearful thinking plays in creating fears, excessive worry, and anxiety / panic.\ Reduce compulsive behaviors by utilizing coping skills discussed. Patient to continue take medications as prescribed and report any concerns to prescribing provider.  Assessment of progress:  progressing .  Waldron Session, [facility]

## 2020-04-15 ENCOUNTER — Other Ambulatory Visit: Payer: Self-pay

## 2020-04-15 ENCOUNTER — Ambulatory Visit (INDEPENDENT_AMBULATORY_CARE_PROVIDER_SITE_OTHER): Payer: 59 | Admitting: Mental Health

## 2020-04-15 DIAGNOSIS — F429 Obsessive-compulsive disorder, unspecified: Secondary | ICD-10-CM | POA: Diagnosis not present

## 2020-04-15 NOTE — Progress Notes (Signed)
[facility] Counselor/Therapist Progress Note   Patient ID: Michaela Pena, MRN: [id number]  Date: 04/15/20   Timespent:   53 minutes    Treatment Type: Individual therapy  Mental Status Exam:   Appearance:   casual  Behavior:  wnl  Motor:  wnl  Speech/Language:   Clear and Coherent  Affect:   Full range  Mood:  anxious, pleasant  Thought process:  Coherent and Relevant, goal directed  Thought content:    WDL, no SI/HI  Perceptual disturbances:    none  Orientation:  Full (Time, Place, and Person)  Attention:  Good  Concentration:  good  Memory:  WNL  Fund of knowledge:   Good  Insight:    Good  Judgment:   Good  Impulse Control:  developing   Reported Symptoms: Daily anxiety, obsessive thoughts, compulsive behavior, panic attacks  Risk Assessment: Danger to Self:  No Self-injurious Behavior: No Danger to Others: No Duty to Warn:no Physical Aggression / Violence:No  Access to Firearms a concern: No  Gang Involvement:No   Patient / guardian was educated about steps to take if suicide or homicide risk level increases between visits. While future psychiatric events cannot be accurately predicted, the patient does not currently require acute inpatient psychiatric care and does not currently meet [facility] involuntary commitment criteria.   Subjective:  Patient presents for session on time.  Gave probes to assess progress, recent events.  She stated that she plans to go to school in the fall and continues to work and babysitting.  Discussed progress with some of her obsessive behaviors where she went on to provide further details related.  She stated her sister had some friends over recently, how this was difficult and patient found herself up at 2:30 in the morning to mop the floor due to the friends being over and wearing shoes in the house.  She stated that she might feel comfortable eventually living on her own as she will have no one to "deal with",  referring to having to clean up after others who do not adhere to her standards of cleanliness.  She admits that many of her standards are not "logical".  Facilitated her sharing how she would like her life to be without these tendencies subsequent behaviors.  She stated that she had "quirks" that she would be self-conscious if she were to date, but that she can spend time with friends at times, can feel embarrassed about when her friends may notice.  We review systematic desensitization steps she can take between sessions, encouraging her to identify the logical self talk with which she was able to identify today between sessions.  Interventions: CBT, Solution Focused and Strength-based  Diagnosis:    ICD-10-CM   1. Obsessive-compulsive disorder, unspecified type  F42.9      Plan: Patient is to use CBT and coping skills to help manage her daily anxiety and obsessive compulsive behaviors.  Patient to continue to work on decreasing her compulsive behaviors, utilizing cognitive and behavioral skills.  Patient to continue to utilize coping skills discussed in session to decrease obsessive compulsive behaviors.  long-term goal:   Reduce overall level, frequency, and intensity of the feelings of anxiety and obsessive-compulsive behaviors so that daily functioning is not impaired.  Short-term goal:  Verbalize an understanding of how thoughts, physical feelings, and behavioral actions contribute to anxiety and its treatment. Verbalize an understanding of the role that fearful thinking plays in creating fears, excessive worry, and anxiety /  panic.\ Reduce compulsive behaviors by utilizing coping skills discussed. Patient to continue take medications as prescribed and report any concerns to prescribing provider.  Assessment of progress:  progressing .  Waldron Session, [facility]

## 2020-04-29 ENCOUNTER — Other Ambulatory Visit: Payer: Self-pay

## 2020-04-29 ENCOUNTER — Ambulatory Visit (INDEPENDENT_AMBULATORY_CARE_PROVIDER_SITE_OTHER): Payer: 59 | Admitting: Mental Health

## 2020-04-29 DIAGNOSIS — F429 Obsessive-compulsive disorder, unspecified: Secondary | ICD-10-CM

## 2020-04-29 NOTE — Progress Notes (Signed)
     [facility] Counselor/Therapist Progress Note   Patient ID: Michaela Pena, MRN: [id number]  Date: 04/29/20   Timespent:   50 minutes    Treatment Type: Individual therapy  Mental Status Exam:   Appearance:   casual  Behavior:  wnl  Motor:  wnl  Speech/Language:   Clear and Coherent  Affect:  Full range  Mood:  anxious, pleasant  Thought process:  Coherent and Relevant, goal directed  Thought content:    WDL, no SI/HI  Perceptual disturbances:    none  Orientation:  Full (Time, Place, and Person)  Attention:  Good  Concentration:  good  Memory:  WNL  Fund of knowledge:   Good  Insight:    Good  Judgment:   Good  Impulse Control:  developing   Reported Symptoms: Daily anxiety, obsessive thoughts, compulsive behavior, panic attacks  Risk Assessment: Danger to Self:  No Self-injurious Behavior: No Danger to Others: No Duty to Warn:no Physical Aggression / Violence:No  Access to Firearms a concern: No  Gang Involvement:No   Patient / guardian was educated about steps to take if suicide or homicide risk level increases between visits. While future psychiatric events cannot be accurately predicted, the patient does not currently require acute inpatient psychiatric care and does not currently meet [facility] involuntary commitment criteria.   Subjective:  Patient presents for session on time.  She stated her mother has set her up with chidduch dating counseling.  She stated she is supposed to start this Thursday for a session once per month.  She continues to endorse not wanting to get married or even date at this point, feeling the coming process as somewhat futile.  We continue to explore some of her obsessive-compulsive behaviors as well as relevant history.  Through guided discovery, she identified her tendency to suppress her feelings, some obsessive behaviors going back as early as ages 54 or 9 where she had a tendency to lock doors at night.  The theme "do  not mess up" was also identified and we encouraged her to further journal, consider between sessions for further discussion.   Interventions: CBT, Solution Focused and Strength-based  Diagnosis:  No diagnosis found.   Plan: Patient is to use CBT and coping skills to help manage her daily anxiety and obsessive compulsive behaviors.  Patient to continue to work on decreasing her compulsive behaviors, utilizing cognitive and behavioral skills.  Patient to continue to utilize coping skills discussed in session to decrease obsessive compulsive behaviors.  long-term goal:   Reduce overall level, frequency, and intensity of the feelings of anxiety and obsessive-compulsive behaviors so that daily functioning is not impaired.  Short-term goal:  Verbalize an understanding of how thoughts, physical feelings, and behavioral actions contribute to anxiety and its treatment. Verbalize an understanding of the role that fearful thinking plays in creating fears, excessive worry, and anxiety / panic.\ Reduce compulsive behaviors by utilizing coping skills discussed. Patient to continue take medications as prescribed and report any concerns to prescribing provider.  Assessment of progress:  progressing .  Michaela Pena Session, [facility]

## 2020-05-13 ENCOUNTER — Other Ambulatory Visit: Payer: Self-pay

## 2020-05-13 ENCOUNTER — Ambulatory Visit (INDEPENDENT_AMBULATORY_CARE_PROVIDER_SITE_OTHER): Payer: 59 | Admitting: Mental Health

## 2020-05-13 DIAGNOSIS — F429 Obsessive-compulsive disorder, unspecified: Secondary | ICD-10-CM

## 2020-05-13 NOTE — Progress Notes (Signed)
     [facility] Counselor/Therapist Progress Note   Patient ID: Michaela Pena, MRN: [id number]  Date: 05/13/20   Timespent:   49 minutes    Treatment Type: Individual therapy  Mental Status Exam:   Appearance:   casual  Behavior:  wnl  Motor:  wnl  Speech/Language:   Clear and Coherent  Affect:  Full range  Mood:  anxious, pleasant  Thought process:  Coherent and Relevant, goal directed  Thought content:    WDL, no SI/HI  Perceptual disturbances:    none  Orientation:  Full (Time, Place, and Person)  Attention:  Good  Concentration:  good  Memory:  WNL  Fund of knowledge:   Good  Insight:    Good  Judgment:   Good  Impulse Control:  developing   Reported Symptoms: Daily anxiety, obsessive thoughts, compulsive behavior, panic attacks  Risk Assessment: Danger to Self:  No Self-injurious Behavior: No Danger to Others: No Duty to Warn:no Physical Aggression / Violence:No  Access to Firearms a concern: No  Gang Involvement:No   Patient / guardian was educated about steps to take if suicide or homicide risk level increases between visits. While future psychiatric events cannot be accurately predicted, the patient does not currently require acute inpatient psychiatric care and does not currently meet [facility] involuntary commitment criteria.   Subjective:  Patient presents for session on time.  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  She plans to further consider factors in making of the decision.  She recently engaged in Shid[facility] specific counseling arranged by her mother.  She shared experiences on the first session; patient maintains she does not want to go through the process but does so to appease her mother. We went on to further discuss some of her history of anxiety and  obsessive-compulsive behaviors as discussed last session.  Through guided discovery, she identified the relationship with her father as a potential connection to some of her anxiety.  Explored with patient.  Encouraged her to further consider between sessions, journaling was encouraged.    Interventions: CBT, Solution Focused and Strength-based  Diagnosis:    ICD-10-CM   1. Obsessive-compulsive disorder, unspecified type  F42.9      Plan: Patient is to use CBT and coping skills to help manage her daily anxiety and obsessive compulsive behaviors.  Patient to continue to work on decreasing her compulsive behaviors, utilizing cognitive and behavioral skills.  Patient to continue to utilize coping skills discussed in session to decrease obsessive compulsive behaviors.  long-term goal:   Reduce overall level, frequency, and intensity of the feelings of anxiety and obsessive-compulsive behaviors so that daily functioning is not impaired.  Short-term goal:  Verbalize an understanding of how thoughts, physical feelings, and behavioral actions contribute to anxiety and its treatment. Verbalize an understanding of the role that fearful thinking plays in creating fears, excessive worry, and anxiety / panic. Reduce compulsive behaviors by utilizing coping skills discussed. Patient to continue take medications as prescribed and report any concerns to prescribing provider.  Assessment of progress:  progressing .  Waldron Session, [facility]

## 2020-05-27 ENCOUNTER — Other Ambulatory Visit: Payer: Self-pay

## 2020-05-27 ENCOUNTER — Ambulatory Visit (INDEPENDENT_AMBULATORY_CARE_PROVIDER_SITE_OTHER): Payer: 59 | Admitting: Mental Health

## 2020-05-27 DIAGNOSIS — F429 Obsessive-compulsive disorder, unspecified: Secondary | ICD-10-CM | POA: Diagnosis not present

## 2020-05-27 NOTE — Progress Notes (Signed)
[facility] Counselor/Therapist Progress Note   Patient ID: Michaela Pena, MRN: [id number]  Date: 05/27/20   Timespent:   50 minutes    Treatment Type: Individual therapy  Mental Status Exam:   Appearance:   casual  Behavior:  wnl  Motor:  wnl  Speech/Language:   Clear and Coherent  Affect:  Full range  Mood:  anxious, pleasant  Thought process:  Coherent and Relevant, goal directed  Thought content:    WDL, no SI/HI  Perceptual disturbances:    none  Orientation:  Full (Time, Place, and Person)  Attention:  Good  Concentration:  good  Memory:  WNL  Fund of knowledge:   Good  Insight:    Good  Judgment:   Good  Impulse Control:  developing   Reported Symptoms: Daily anxiety, obsessive thoughts, compulsive behavior, panic attacks  Risk Assessment: Danger to Self:  No Self-injurious Behavior: No Danger to Others: No Duty to Warn:no Physical Aggression / Violence:No  Access to Firearms a concern: No  Gang Involvement:No   Patient / guardian was educated about steps to take if suicide or homicide risk level increases between visits. While future psychiatric events cannot be accurately predicted, the patient does not currently require acute inpatient psychiatric care and does not currently meet [facility] involuntary commitment criteria.   Subjective:  Patient presents for session on time.  She shared how she continues to engage in the counseling for Shidduch.  She remains reluctant but continues to go as her mother set up the sessions.  She also continues to not want to move forward with the marriage process.  She stated her sister is abroad engaged in the process currently.  She worries that her lack of participation might affect her sisters due how the process is carried out through her family's faith.  We reviewed some content from previous session related to her history with anxiety, acknowledging that her relationship with her father is a partial factor,  sharing more details related.  She stated they have not had contact for the last several months, which is typical.  He is she stated that he also has not been in contact with his sisters often.  She continues to work on decreasing some of her obsessive compulsive behaviors, most recently continuing to mop daily at her house.  Collaboratively, we explored ways to take steps in meeting some days as opposed to completing this test daily.  She continues to not check her gas tank and recognizes how abstaining from driving due to COVID was at least partially a factor in her being successful in eliminating this behavior.    Interventions: CBT, Solution Focused and Strength-based  Diagnosis:    ICD-10-CM   1. Obsessive-compulsive disorder, unspecified type  F42.9      Plan: Patient is to use CBT and coping skills to help manage her daily anxiety and obsessive compulsive behaviors.  Patient to continue to work on decreasing her compulsive behaviors, utilizing cognitive and behavioral skills.  Patient to continue to utilize coping skills discussed in session to decrease obsessive compulsive behaviors.  long-term goal:   Reduce overall level, frequency, and intensity of the feelings of anxiety and obsessive-compulsive behaviors so that daily functioning is not impaired.  Short-term goal:  Verbalize an understanding of how thoughts, physical feelings, and behavioral actions contribute to anxiety and its treatment. Verbalize an understanding of the role that fearful thinking plays in creating fears, excessive worry, and anxiety / panic. Reduce compulsive behaviors  by utilizing coping skills discussed. Patient to continue take medications as prescribed and report any concerns to prescribing provider.  Assessment of progress:  progressing .  Waldron Session, [facility]

## 2020-06-11 ENCOUNTER — Other Ambulatory Visit: Payer: Self-pay

## 2020-06-11 ENCOUNTER — Ambulatory Visit (INDEPENDENT_AMBULATORY_CARE_PROVIDER_SITE_OTHER): Payer: 59 | Admitting: Mental Health

## 2020-06-11 DIAGNOSIS — F429 Obsessive-compulsive disorder, unspecified: Secondary | ICD-10-CM

## 2020-06-11 NOTE — Progress Notes (Signed)
[facility] Counselor/Therapist Progress Note   Patient ID: Michaela Pena, MRN: [id number]  Date: 06/11/20   Timespent:   53 minutes    Treatment Type: Individual therapy  Mental Status Exam:   Appearance:   casual  Behavior:  wnl  Motor:  wnl  Speech/Language:   Clear and Coherent  Affect:  Full range  Mood:  anxious, pleasant  Thought process:  Coherent and Relevant, goal directed  Thought content:    WDL, no SI/HI  Perceptual disturbances:    none  Orientation:  Full (Time, Place, and Person)  Attention:  Good  Concentration:  good  Memory:  WNL  Fund of knowledge:   Good  Insight:    Good  Judgment:   Good  Impulse Control:  developing   Reported Symptoms: Daily anxiety, obsessive thoughts, compulsive behavior, panic attacks  Risk Assessment: Danger to Self:  No Self-injurious Behavior: No Danger to Others: No Duty to Warn:no Physical Aggression / Violence:No  Access to Firearms a concern: No  Gang Involvement:No   Patient / guardian was educated about steps to take if suicide or homicide risk level increases between visits. While future psychiatric events cannot be accurately predicted, the patient does not currently require acute inpatient psychiatric care and does not currently meet [facility] involuntary commitment criteria.   Subjective:  Patient presents for session on time.  Assessed progress where she stated that she continues to engage in the Shidduch specific counseling arranged by her mother.   She continues to not feel to go forward with the process as she does not want to get married.  She continues to engage in the counseling process to appease her mother and she knows it is important to to her.  Progress with some of her obsessive-compulsive behaviors were assessed where she says she continues to engage in various behaviors, primarily around the room.  We discussed some content from recent sessions regarding her mopping at least 2 times  daily.  Continue to facilitate her identifying thoughts, rationale for the behavior.  Through guided discovery, she identified acknowledged the behavior excessive.  When asked what would be the most appropriate amount on a weekly basis she shared "probably every other day".  This being too difficult with a step to take initially, to go a full week not only every other day, she plans to decrease the behavior to once per day over the next few days.  We encouraged her to be intentional about identifying changes, thoughts and feelings associated, completing the process.   Interventions: CBT, Solution Focused and Strength-based  Diagnosis:    ICD-10-CM   1. Obsessive-compulsive disorder, unspecified type  F42.9      Plan: Patient is to use CBT and coping skills to help manage her daily anxiety and obsessive compulsive behaviors.  Patient to continue to work on decreasing her compulsive behaviors, utilizing cognitive and behavioral skills.  Patient to continue to utilize coping skills discussed in session to decrease obsessive compulsive behaviors.  long-term goal:   Reduce overall level, frequency, and intensity of the feelings of anxiety and obsessive-compulsive behaviors so that daily functioning is not impaired.  Short-term goal:  Verbalize an understanding of how thoughts, physical feelings, and behavioral actions contribute to anxiety and its treatment. Verbalize an understanding of the role that fearful thinking plays in creating fears, excessive worry, and anxiety / panic. Reduce compulsive behaviors by utilizing coping skills discussed. Patient to continue take medications as prescribed and report any concerns  to prescribing provider.  Assessment of progress:  progressing .  Waldron Session, [facility]

## 2020-06-24 ENCOUNTER — Other Ambulatory Visit: Payer: Self-pay

## 2020-06-24 ENCOUNTER — Ambulatory Visit: Payer: 59 | Admitting: Mental Health

## 2020-06-24 DIAGNOSIS — F429 Obsessive-compulsive disorder, unspecified: Secondary | ICD-10-CM | POA: Diagnosis not present

## 2020-06-24 NOTE — Progress Notes (Signed)
[facility] Counselor/Therapist Progress Note   Patient ID: Jeneen Doutt, MRN: [id number]  Date: 06/24/20  Timespent: 53 minutes    Treatment Type: Individual therapy  Mental Status Exam:    Appearance:   casual  Behavior:  wnl  Motor:  wnl  Speech/Language:   Clear and Coherent  Affect:  Full range  Mood:  anxious, pleasant  Thought process:  Coherent and Relevant, goal directed  Thought content:    WDL, no SI/HI  Perceptual disturbances:    none  Orientation:  Full (Time, Place, and Person)  Attention:  Good  Concentration:  good  Memory:  WNL  Fund of knowledge:   Good  Insight:    Good  Judgment:   Good  Impulse Control:  developing   Reported Symptoms: Daily anxiety, obsessive thoughts, compulsive behavior, panic attacks  Risk Assessment: Danger to Self:  No Self-injurious Behavior: No Danger to Others: No Duty to Warn:no Physical Aggression / Violence:No  Access to Firearms a concern: No  Gang Involvement:No   Patient / guardian was educated about steps to take if suicide or homicide risk level increases between visits. While future psychiatric events cannot be accurately predicted, the patient does not currently require acute inpatient psychiatric care and does not currently meet [facility] involuntary commitment criteria.   Subjective:  Patient presents for session on time.  She shared progress, how she and her sisters went to [place] to attend the funeral of their great uncle.  She stated that during the visit, her father and his wife offered them to stay with him for the remainder of the weekend, in which they did.  She went on to share experiences, stating that the trip went well overall but awkward at times due to the length of time with which she and her sisters have not spoken to their father.  For patient has been about 1 year.  She feels that his wife facilitated their being able to visit.  She stated when she informed her mother upon  return, she was shocked that he offered them to stay with him.  She continues to engage in the [facility] counseling process; patient continues to identify not wanting to engage in the process, does this to appease her mother.  Facilitated her identifying feelings related, shares she "tries to block out" the thought of having a more direct discussion with her mother, identified the fear of being separated from the family she does not go forward with the process which ultimately gives her anxiety.  She plans to continue the sessions to appease her mother.  Provide support and understanding throughout.   Interventions: Motivational interviewing, supportive therapy  Diagnosis:    ICD-10-CM   1. Obsessive-compulsive disorder, unspecified type  F42.9         Plan: Patient is to use CBT and coping skills to help manage her daily anxiety and obsessive compulsive behaviors.  Patient to continue to work on decreasing her compulsive behaviors, utilizing cognitive and behavioral skills.  Patient to continue to utilize coping skills discussed in session to decrease obsessive compulsive behaviors.  long-term goal:   Reduce overall level, frequency, and intensity of the feelings of anxiety and obsessive-compulsive behaviors so that daily functioning is not impaired.  Short-term goal:  Verbalize an understanding of how thoughts, physical feelings, and behavioral actions contribute to anxiety and its treatment. Verbalize an understanding of the role that fearful thinking plays in creating fears, excessive worry, and anxiety / panic. Reduce  compulsive behaviors by utilizing coping skills discussed. Patient to continue take medications as prescribed and report any concerns to prescribing provider.  Assessment of progress:  progressing .  Waldron Session, [facility]

## 2020-07-08 ENCOUNTER — Ambulatory Visit: Payer: 59 | Admitting: Mental Health

## 2020-07-19 ENCOUNTER — Ambulatory Visit: Payer: 59 | Admitting: Mental Health

## 2020-07-19 ENCOUNTER — Other Ambulatory Visit: Payer: Self-pay

## 2020-07-19 DIAGNOSIS — F429 Obsessive-compulsive disorder, unspecified: Secondary | ICD-10-CM

## 2020-07-19 NOTE — Progress Notes (Signed)
[facility] Counselor/Therapist Progress Note   Patient ID: Michaela Pena, MRN: [id number]  Date: 07/18/20  Timespent: 55 minutes    Treatment Type: Individual therapy  Mental Status Exam:    Appearance:   casual  Behavior:  wnl  Motor:  wnl  Speech/Language:   Clear and Coherent  Affect:  Full range  Mood:  anxious, pleasant  Thought process:  Coherent and Relevant, goal directed  Thought content:    WDL, no SI/HI  Perceptual disturbances:    none  Orientation:  Full (Time, Place, and Person)  Attention:  Good  Concentration:  good  Memory:  WNL  Fund of knowledge:   Good  Insight:    Good  Judgment:   Good  Impulse Control:  developing   Reported Symptoms: Daily anxiety, obsessive thoughts, compulsive behavior, panic attacks  Risk Assessment: Danger to Self:  No Self-injurious Behavior: No Danger to Others: No Duty to Warn:no Physical Aggression / Violence:No  Access to Firearms a concern: No  Gang Involvement:No   Patient / guardian was educated about steps to take if suicide or homicide risk level increases between visits. While future psychiatric events cannot be accurately predicted, the patient does not currently require acute inpatient psychiatric care and does not currently meet [facility] involuntary commitment criteria.   Subjective:  Patient presents for session on time.  Assessed progress where she stated that she continues to attend shidduch counseling.  She went on to share how she continues to not want to engage in the process, how she learned from her mother that the counselor had spoken with her mentioning patient getting a life coach.  She said her mother told her recently that she "has no direction" when exploring with patient however, the direction she stated her mother was referring to was for her to get married as it lives with their faith.  Patient continues to express not wanting to get married, at least at this point in her life.   Through guided discovery, identifying how her life goals different than what her mother would want.  Patient continues to work in childcare full-time, however, identifies this as a potential change needed as she expressed wanting to complete her bachelor's degree by attending one final semester in school.  She shared how her mother had recently started charging her rent as a way to let patient experience that responsibility however, patient states that her mother does not focus on her completing college, only to begin to reengage in the Shidduch process.  Patient identified that she probably needs to be more assertive in expressing her thoughts and feelings with her mother as she states she avoids this to avoid her mother having a "meltdown".  Collaboratively, we explore ways to communicate in the relationship as patient acknowledges that she is suppressing feelings.  Interventions: Motivational interviewing, supportive therapy  Diagnosis:    ICD-10-CM   1. Obsessive-compulsive disorder, unspecified type  F42.9          Plan: Patient is to use CBT and coping skills to help manage her daily anxiety and obsessive compulsive behaviors.  Patient to continue to work on decreasing her compulsive behaviors, utilizing cognitive and behavioral skills.  Patient to consider ways to communicate in a more assertive fashion with her mother.  long-term goal:   Reduce overall level, frequency, and intensity of the feelings of anxiety and obsessive-compulsive behaviors so that daily functioning is not impaired.  Short-term goal:  Verbalize an understanding of how  thoughts, physical feelings, and behavioral actions contribute to anxiety and its treatment. Verbalize an understanding of the role that fearful thinking plays in creating fears, excessive worry, and anxiety / panic. Reduce compulsive behaviors by utilizing coping skills discussed. Patient to continue take medications as prescribed and report any  concerns to prescribing provider.  Assessment of progress:  progressing .  Waldron Session, [facility]

## 2020-08-01 ENCOUNTER — Ambulatory Visit: Payer: 59 | Admitting: Mental Health

## 2020-08-01 ENCOUNTER — Other Ambulatory Visit: Payer: Self-pay

## 2020-08-01 DIAGNOSIS — F429 Obsessive-compulsive disorder, unspecified: Secondary | ICD-10-CM

## 2020-08-01 NOTE — Progress Notes (Signed)
[facility] Counselor/Therapist Progress Note   Patient ID: Michaela Pena, MRN: [id number]  Date: 08/01/20  Timespent: 52 minutes    Treatment Type: Individual therapy  Mental Status Exam:    Appearance:   casual  Behavior:  wnl  Motor:  wnl  Speech/Language:   Clear and Coherent  Affect:  Full range  Mood:  anxious, pleasant  Thought process:  Coherent and Relevant, goal directed  Thought content:    WDL, no SI/HI  Perceptual disturbances:    none  Orientation:  Full (Time, Place, and Person)  Attention:  Good  Concentration:  good  Memory:  WNL  Fund of knowledge:   Good  Insight:    Good  Judgment:   Good  Impulse Control:  developing   Reported Symptoms: Daily anxiety, obsessive thoughts, compulsive behavior, panic attacks  Risk Assessment: Danger to Self:  No Self-injurious Behavior: No Danger to Others: No Duty to Warn:no Physical Aggression / Violence:No  Access to Firearms a concern: No  Gang Involvement:No   Patient / guardian was educated about steps to take if suicide or homicide risk level increases between visits. While future psychiatric events cannot be accurately predicted, the patient does not currently require acute inpatient psychiatric care and does not currently meet [facility] involuntary commitment criteria.   Subjective:  Patient presents for session on time.  Patient shared progress.  She stated that she continues to attend her Sh[facility] counseling, continuing to feel that not being needed but continues to go to appease her mother.  She stated that the counselor wants to see her weekly to make more progress however, patient states that the problem is her lack of motivation to engage in the process as she continues to not want to get married at this point in her life.  She stated her mother has not run up to her over the last 2 weeks, but she knows that the counselor and her mother may talk at any time without her being aware, which  does not make her feel comfortable.  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  She stated she restarted the behaviors upon arriving at home and we further discussed thoughts associated, differences in what changed her behaviors as she reflected in session.  She stated she had not realized that in the moment but that she plans to further contemplate between sessions in an effort to further identify thoughts that trigger the behaviors, believes as she continues to state that she wants to decrease the behavior overall, which is mopping, cleaning etc.  Interventions: Motivational interviewing, supportive therapy  Diagnosis:    ICD-10-CM   1. Obsessive-compulsive disorder, unspecified type  F42.9           Plan: Patient is to use CBT and coping skills to help manage her daily anxiety and obsessive compulsive behaviors.  Patient to continue to work on decreasing her compulsive behaviors, utilizing cognitive and behavioral skills.  Patient to consider ways to communicate in a more assertive fashion with her mother.  long-term goal:   Reduce overall level, frequency, and intensity of the feelings of anxiety and obsessive-compulsive behaviors so that daily functioning is not impaired.  Short-term goal:  Verbalize an understanding of how thoughts, physical feelings, and behavioral actions contribute  to anxiety and its treatment. Verbalize an understanding of the role that fearful thinking plays in creating fears, excessive worry, and anxiety / panic. Reduce compulsive behaviors by utilizing coping skills discussed. Patient to continue take medications as prescribed and report any concerns to prescribing provider.  Assessment of progress:  progressing .  Waldron Session, [facility]

## 2020-08-12 ENCOUNTER — Other Ambulatory Visit: Payer: Self-pay

## 2020-08-12 ENCOUNTER — Ambulatory Visit: Payer: 59 | Admitting: Mental Health

## 2020-08-12 DIAGNOSIS — F429 Obsessive-compulsive disorder, unspecified: Secondary | ICD-10-CM | POA: Diagnosis not present

## 2020-08-12 NOTE — Progress Notes (Signed)
[facility] Counselor/Therapist Progress Note   Patient ID: Michaela Pena, MRN: [id number]  Date: 08/12/20  Timespent: 51 minutes    Treatment Type: Individual therapy  Mental Status Exam:    Appearance:   casual  Behavior:  wnl  Motor:  wnl  Speech/Language:   Clear and Coherent  Affect:  Full range  Mood:  anxious, pleasant  Thought process:  Coherent and Relevant, goal directed  Thought content:    WDL, no SI/HI  Perceptual disturbances:    none  Orientation:  Full (Time, Place, and Person)  Attention:  Good  Concentration:  good  Memory:  WNL  Fund of knowledge:   Good  Insight:    Good  Judgment:   Good  Impulse Control:  developing   Reported Symptoms: Daily anxiety, obsessive thoughts, compulsive behavior, panic attacks  Risk Assessment: Danger to Self:  No Self-injurious Behavior: No Danger to Others: No Duty to Warn:no Physical Aggression / Violence:No  Access to Firearms a concern: No  Gang Involvement:No   Patient / guardian was educated about steps to take if suicide or homicide risk level increases between visits. While future psychiatric events cannot be accurately predicted, the patient does not currently require acute inpatient psychiatric care and does not currently meet [facility] involuntary commitment criteria.   Subjective:  Patient presents for session on time.  Patient shared how she is seeing her Shidduch counselor weekly currently, patient continues to engage in volunteering at her local [employer].  Patient shared how she is willing to volunteer however, there is a component of this that relates to appeasing her mother.  She continues to feel it is not helpful to continue to engage in the process however, patient continues this as well for the same reason.  She continues to work and babysitting, financially is doing well, continues to pay her mother rent and is now responsible for some other bills specific to herself.  She  continues to consider returning to college to complete her degree which consists of 1 semester but has not decided if she will return next semester or not at this point.  She stated that her younger sister, being away at college, has facilitated her in making some changes with her routine in terms of cleanliness.  She stated that she now is mopping the floor in the house once a day versus 2 times or more per day.  She stated that her sister, who tended to walk throughout the house with her shoes on was a catalyst for patient needing to mop in the evenings.  With her being gone, patient is less inclined to mop a second time.  Facilitated her identifying subsequent thoughts related to this behavior as well as lock checking that support the behavior, and ones that do not.  Patient acknowledged it being not always necessary to check the locks as it is 3 times in the evening.   Interventions: Motivational interviewing, supportive therapy  Diagnosis:    ICD-10-CM   1. Obsessive-compulsive disorder, unspecified type  F42.9         Plan: Patient is to use CBT and coping skills to help manage her daily anxiety and obsessive compulsive behaviors.  Patient to continue to work on decreasing her compulsive behaviors, utilizing cognitive and behavioral skills.  Patient to consider ways to communicate in a more assertive fashion with her mother.  long-term goal:   Reduce overall level, frequency, and intensity of the feelings of anxiety and obsessive-compulsive behaviors  so that daily functioning is not impaired.  Short-term goal:  Verbalize an understanding of how thoughts, physical feelings, and behavioral actions contribute to anxiety and its treatment. Verbalize an understanding of the role that fearful thinking plays in creating fears, excessive worry, and anxiety / panic. Reduce compulsive behaviors by utilizing coping skills discussed. Patient to continue take medications as prescribed and report any  concerns to prescribing provider.  Assessment of progress:  progressing .  Waldron Session, [facility]

## 2020-08-26 ENCOUNTER — Other Ambulatory Visit: Payer: Self-pay

## 2020-08-26 ENCOUNTER — Ambulatory Visit: Payer: 59 | Admitting: Mental Health

## 2020-08-26 DIAGNOSIS — F429 Obsessive-compulsive disorder, unspecified: Secondary | ICD-10-CM

## 2020-08-26 NOTE — Progress Notes (Signed)
     [facility] Counselor/Therapist Progress Note   Patient ID: Michaela Pena, MRN: [id number]  Date: 08/26/20  Timespent: 50 minutes    Treatment Type: Individual therapy  Mental Status Exam:    Appearance:   casual  Behavior:  wnl  Motor:  wnl  Speech/Language:   Clear and Coherent  Affect:  Full range  Mood:  anxious, pleasant  Thought process:  Coherent and Relevant, goal directed  Thought content:    WDL, no SI/HI  Perceptual disturbances:    none  Orientation:  Full (Time, Place, and Person)  Attention:  Good  Concentration:  good  Memory:  WNL  Fund of knowledge:   Good  Insight:    Good  Judgment:   Good  Impulse Control:  developing   Reported Symptoms: Daily anxiety, obsessive thoughts, compulsive behavior, panic attacks  Risk Assessment: Danger to Self:  No Self-injurious Behavior: No Danger to Others: No Duty to Warn:no Physical Aggression / Violence:No  Access to Firearms a concern: No  Gang Involvement:No   Patient / guardian was educated about steps to take if suicide or homicide risk level increases between visits. While future psychiatric events cannot be accurately predicted, the patient does not currently require acute inpatient psychiatric care and does not currently meet [facility] involuntary commitment criteria.   Subjective:  Patient presents for session on time.  Patient sharing progress, how she went on a recent vacation with her family. She stated that she continues to get some pressure, most recently from her sister regarding Shidduch. She stayed her mother has not talked to her recently about it but she has a session in a couple of days with her faith-based counselor. We discussed her progress regarding some of her obsessive compulsive behaviors, lock checking etc. She stated some of these behaviors were less while on vacation which is typical when she's not at home. Facilitated her identifying some thoughts she might have if she  tries to interrupt this behavior, lock checking at home. She was able to identify "I know it's locked the first time I lock it".  She plans to remind herself of this as she plans to decrease the behavior between sessions.   Interventions: Motivational interviewing, supportive therapy  Diagnosis:    ICD-10-CM   1. Obsessive-compulsive disorder, unspecified type  F42.9          Plan: Patient is to use CBT and coping skills to help manage her daily anxiety and obsessive compulsive behaviors.  Patient to continue to work on decreasing her compulsive behaviors, utilizing cognitive and behavioral skills.  Patient to consider ways to communicate in a more assertive fashion with her mother.  long-term goal:   Reduce overall level, frequency, and intensity of the feelings of anxiety and obsessive-compulsive behaviors so that daily functioning is not impaired.  Short-term goal:  Verbalize an understanding of how thoughts, physical feelings, and behavioral actions contribute to anxiety and its treatment. Verbalize an understanding of the role that fearful thinking plays in creating fears, excessive worry, and anxiety / panic. Reduce compulsive behaviors by utilizing coping skills discussed. Patient to continue take medications as prescribed and report any concerns to prescribing provider.  Assessment of progress:  progressing .  Waldron Session, [facility]

## 2020-09-09 ENCOUNTER — Other Ambulatory Visit: Payer: Self-pay

## 2020-09-09 ENCOUNTER — Ambulatory Visit: Payer: 59 | Admitting: Mental Health

## 2020-09-09 DIAGNOSIS — F429 Obsessive-compulsive disorder, unspecified: Secondary | ICD-10-CM | POA: Diagnosis not present
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
## 2020-09-23 ENCOUNTER — Other Ambulatory Visit: Payer: Self-pay

## 2020-09-23 ENCOUNTER — Ambulatory Visit: Payer: 59 | Admitting: Mental Health

## 2020-09-23 DIAGNOSIS — F429 Obsessive-compulsive disorder, unspecified: Secondary | ICD-10-CM

## 2020-09-23 NOTE — Progress Notes (Signed)
[facility] Counselor/Therapist Progress Note   Patient ID: Michaela Pena, MRN: [id number]  Date: 09/23/20  Timespent: 51 minutes    Treatment Type: Individual therapy  Mental Status Exam:    Appearance:   casual  Behavior:  wnl  Motor:  wnl  Speech/Language:   Clear and Coherent  Affect:  Full range  Mood:  anxious, pleasant  Thought process:  Coherent and Relevant, goal directed  Thought content:    WDL, no SI/HI  Perceptual disturbances:    none  Orientation:  Full (Time, Place, and Person)  Attention:  Good  Concentration:  good  Memory:  WNL  Fund of knowledge:   Good  Insight:    Good  Judgment:   Good  Impulse Control:  developing   Reported Symptoms: Daily anxiety, obsessive thoughts, compulsive behavior, panic attacks  Risk Assessment: Danger to Self:  No Self-injurious Behavior: No Danger to Others: No Duty to Warn:no Physical Aggression / Violence:No  Access to Firearms a concern: No  Gang Involvement:No   Patient / guardian was educated about steps to take if suicide or homicide risk level increases between visits. While future psychiatric events cannot be accurately predicted, the patient does not currently require acute inpatient psychiatric care and does not currently meet [facility] involuntary commitment criteria.   Subjective:  Patient presents for session on time.  Assessed progress where she stated that she received a text from her father earlier today, that he wanted to speak to her on the phone today at 3 PM.  Patient went on to speculate what the conversation would be about.  She feels that it may be concerning her mother dating someone as she has a boyfriend.  She stated that her father learned of him from a postal and social media, then subsequently text the boyfriend directly.  Patient stated that she learned that her father does not like the boyfriend being around "his daughters" as he had put it.  Patient stated her father has  a tendency of reacting with anger in situations; she went on to share how she has met the boyfriend and he is very pleasant.  We went on to discuss her progress with lock checking, she continues to struggle and we utilize MI with patient to further reduce this tendency.     Interventions: Motivational interviewing, supportive therapy  Diagnosis:    ICD-10-CM   1. Obsessive-compulsive disorder, unspecified type  F42.9        Plan: Patient is to use CBT and coping skills to help manage her daily anxiety and obsessive compulsive behaviors.  Patient to continue to work on decreasing her compulsive behaviors, utilizing cognitive and behavioral skills.  Patient to consider ways to communicate in a more assertive fashion with her mother.  long-term goal:   Reduce overall level, frequency, and intensity of the feelings of anxiety and obsessive-compulsive behaviors so that daily functioning is not impaired.  Short-term goal:  Verbalize an understanding of how thoughts, physical feelings, and behavioral actions contribute to anxiety and its treatment. Verbalize an understanding of the role that fearful thinking plays in creating fears, excessive worry, and anxiety / panic. Reduce compulsive behaviors by utilizing coping skills discussed. Patient to continue take medications as prescribed and report any concerns to prescribing provider.  Assessment of progress:  progressing .  Anson [place], [facility]

## 2020-10-04 ENCOUNTER — Ambulatory Visit: Payer: 59 | Admitting: Mental Health

## 2020-10-04 ENCOUNTER — Other Ambulatory Visit: Payer: Self-pay

## 2020-10-04 DIAGNOSIS — F429 Obsessive-compulsive disorder, unspecified: Secondary | ICD-10-CM | POA: Diagnosis not present

## 2020-10-04 NOTE — Progress Notes (Signed)
[facility] Counselor/Therapist Progress Note   Patient ID: Michaela Pena, MRN: [id number]  Date: 10/04/20  Timespent: 51 minutes    Treatment Type: Individual therapy  Mental Status Exam:    Appearance:   casual  Behavior:  wnl  Motor:  wnl  Speech/Language:   Clear and Coherent  Affect:  Full range  Mood:  anxious, pleasant  Thought process:  Coherent and Relevant, goal directed  Thought content:    WDL, no SI/HI  Perceptual disturbances:    none  Orientation:  Full (Time, Place, and Person)  Attention:  Good  Concentration:  good  Memory:  WNL  Fund of knowledge:   Good  Insight:    Good  Judgment:   Good  Impulse Control:  developing   Reported Symptoms: Daily anxiety, obsessive thoughts, compulsive behavior, panic attacks  Risk Assessment: Danger to Self:  No Self-injurious Behavior: No Danger to Others: No Duty to Warn:no Physical Aggression / Violence:No  Access to Firearms a concern: No  Gang Involvement:No   Patient / guardian was educated about steps to take if suicide or homicide risk level increases between visits. While future psychiatric events cannot be accurately predicted, the patient does not currently require acute inpatient psychiatric care and does not currently meet [facility] involuntary commitment criteria.   Subjective:  Patient presents for session on time.  She shared how she spoke to her father as discussed last session.  She stated the conversation went well she and her sisters plan to visit him for the coming religious holiday.  She stated he also asked about her mother's boyfriend as she expected.  She stated she had a recent conversation with her mother where she learned that he had been married 3 times previously.  She stated that he appears pleasant as she has met him a few times.  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  She stated that she was able to  disengage with some of her compulsive behaviors such as lock checking.  She stated she checks only once per night except for one night where it was twice.  She said that she feels that she will be able to possibly decrease the behavior to at least just 1 time per night as she will be gone to visit her father for about a week or 2 in the coming month.  She identified that she continues to want some more independence as she continues to live with her mother while also recognizing it will be more financial stress if she does move out.  She stated she is gotten some pressure from her mother to find a job with more of a career focused.  At this point she is uncertain of what area of work she would want to even focus on at this point.  Also unsure if she will complete her college degree as she has 1 semester left.  She was encouraged to give herself time to make these decisions as well as potentially do some research about potential jobs that she might find appealing towards having a career, which will ultimately give her more independence.  Interventions: Motivational interviewing, supportive therapy  Diagnosis:    ICD-10-CM   1. Obsessive-compulsive disorder, unspecified type  F42.9         Plan: Patient is to use CBT and coping skills to help manage her daily anxiety and obsessive compulsive behaviors.  Patient to continue to work on decreasing her compulsive behaviors, utilizing cognitive and behavioral skills.  long-term goal:   Reduce overall level, frequency, and intensity of the feelings of anxiety and obsessive-compulsive behaviors so that daily functioning is not impaired.  Short-term goal:  Verbalize an understanding of how thoughts, physical feelings, and behavioral actions contribute to anxiety and its treatment. Verbalize an understanding of the role that fearful thinking plays in creating fears, excessive worry, and anxiety / panic. Reduce compulsive behaviors by utilizing coping skills  discussed. Patient to continue take medications as prescribed and report any concerns to prescribing provider.  Assessment of progress:  progressing .  Anson [place], [facility]

## 2020-10-14 ENCOUNTER — Ambulatory Visit: Payer: 59 | Admitting: Mental Health

## 2020-10-14 ENCOUNTER — Other Ambulatory Visit: Payer: Self-pay

## 2020-10-14 DIAGNOSIS — F429 Obsessive-compulsive disorder, unspecified: Secondary | ICD-10-CM

## 2020-10-14 NOTE — Progress Notes (Signed)
     [facility] Counselor/Therapist Progress Note   Patient ID: Michaela Pena, MRN: [id number]  Date: 10/14/20  Timespent: 51 minutes    Treatment Type: Individual therapy  Mental Status Exam:    Appearance:   casual  Behavior:  wnl  Motor:  wnl  Speech/Language:   Clear and Coherent  Affect:  Full range  Mood:  anxious, pleasant  Thought process:  Coherent and Relevant, goal directed  Thought content:    WDL, no SI/HI  Perceptual disturbances:    none  Orientation:  Full (Time, Place, and Person)  Attention:  Good  Concentration:  good  Memory:  WNL  Fund of knowledge:   Good  Insight:    Good  Judgment:   Good  Impulse Control:  developing   Reported Symptoms: Daily anxiety, obsessive thoughts, compulsive behavior, panic attacks  Risk Assessment: Danger to Self:  No Self-injurious Behavior: No Danger to Others: No Duty to Warn:no Physical Aggression / Violence:No  Access to Firearms a concern: No  Gang Involvement:No   Patient / guardian was educated about steps to take if suicide or homicide risk level increases between visits. While future psychiatric events cannot be accurately predicted, the patient does not currently require acute inpatient psychiatric care and does not currently meet [facility] involuntary commitment criteria.   Subjective:  Patient presents for session on time.  She shared recent events, how she and her sisters visited their father out of state over the past week.  She went on to share experiences.  Continues to have a strained relationship with her father, as her sisters do as well.  She went on to share how they spend some time with an aunt and uncle for part of the visit, where she was glad to leave her father's home as it can be uncomfortable to a degree per her report.  She stated that he made some of the visit awkward and uncomfortable by inquiring about her mother's current boyfriend while eating dinner which was also in front  of several family members who also visited due to it being [place]. she identified, she shared how her sisters also have some strain in the relationship with her father due to their history and also having infrequent contact.  She shared how one of her sisters typically can get more upset with him, patient stated that she typically does not show her frustrations outwardly and identified, "I think I should be more mad probably".  Facilitated patient in further identifying and sharing some feelings related to this relationship during today's session.  Encouraged her to consider how this relationship has affected her life.    Interventions: Motivational interviewing, supportive therapy  Diagnosis:    ICD-10-CM   1. Obsessive-compulsive disorder, unspecified type  F42.9        long-term goal:   Reduce overall level, frequency, and intensity of the feelings of anxiety and obsessive-compulsive behaviors so that daily functioning is not impaired.  Short-term goal:  Verbalize an understanding of how thoughts, physical feelings, and behavioral actions contribute to anxiety and its treatment. Verbalize an understanding of the role that fearful thinking plays in creating fears, excessive worry, and anxiety / panic. Reduce compulsive behaviors by utilizing coping skills discussed. Patient to continue take medications as prescribed and report any concerns to prescribing provider.  Assessment of progress:  progressing .  Waldron Session, [facility]

## 2020-10-18 ENCOUNTER — Ambulatory Visit: Payer: 59 | Admitting: Mental Health

## 2020-10-28 ENCOUNTER — Ambulatory Visit: Payer: 59 | Admitting: Mental Health

## 2020-10-28 ENCOUNTER — Other Ambulatory Visit: Payer: Self-pay

## 2020-10-28 DIAGNOSIS — F429 Obsessive-compulsive disorder, unspecified: Secondary | ICD-10-CM | POA: Diagnosis not present

## 2020-10-28 NOTE — Progress Notes (Signed)
[facility] Counselor/Therapist Progress Note   Patient ID: Michaela Pena, MRN: [id number]  Date: 10/28/20  Timespent: 50 minutes    Treatment Type: Individual therapy  Mental Status Exam:    Appearance:   casual  Behavior:  wnl  Motor:  wnl  Speech/Language:   Clear and Coherent  Affect:  Full range  Mood:  anxious, pleasant  Thought process:  Coherent and Relevant, goal directed  Thought content:    WDL, no SI/HI  Perceptual disturbances:    none  Orientation:  Full (Time, Place, and Person)  Attention:  Good  Concentration:  good  Memory:  WNL  Fund of knowledge:   Good  Insight:    Good  Judgment:   Good  Impulse Control:  developing   Reported Symptoms: Daily anxiety, obsessive thoughts, compulsive behavior, panic attacks  Risk Assessment: Danger to Self:  No Self-injurious Behavior: No Danger to Others: No Duty to Warn:no Physical Aggression / Violence:No  Access to Firearms a concern: No  Gang Involvement:No   Patient / guardian was educated about steps to take if suicide or homicide risk level increases between visits. While future psychiatric events cannot be accurately predicted, the patient does not currently require acute inpatient psychiatric care and does not currently meet [facility] involuntary commitment criteria.   Subjective:  Patient presents for session on time.  She shared recent progress, how she and her sisters visited her father again out of state for about a week.  She stated it was to observe a religious holiday.  She went on to share experiences.  She stated that she, and her sisters, all speculated about why their father is reaching out to them more, inviting them more to his home.  Historically, she stated they have stayed with her paternal grandparents in that area when visiting them themselves in the past several years.  She stated that her father moved back to that area of [place] about 2 years ago and since that time  did not invite them until this year.  She stated that while he was living in this area, that he had infrequent contact.  Through guided discovery, she identified that she is the point person of her sisters with which she communicates.  She stated that her being the oldest of the 3 sisters may play a role in his talking to her to speak for her and the rest of her sisters.  She stated he does not communicate with them directly when making such plans.  She experiences that his new wife of the last 2 years may play a role in his being more active in their lives.  Patient identified how she would like her father to communicate more directly with her sisters as opposed to always going through her.  Ways to communicate this need and setting this type of boundary was explored collaboratively. She continues to consider to go back to college to complete her final 2 classes toward earning her college degree.  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  She continues to be unsure of when she will go back to complete her degree but ultimately identified that she "probably" finish it.  Interventions: Motivational interviewing, supportive therapy  Diagnosis: Obsessive-compulsive disorder, unspecified type  long-term goal:   Reduce overall level, frequency, and intensity of the feelings of anxiety and obsessive-compulsive behaviors so that daily functioning is not impaired.  Short-term goal:  Verbalize an understanding of how thoughts,  physical feelings, and behavioral actions contribute to anxiety and its treatment. Verbalize an understanding of the role that fearful thinking plays in creating fears, excessive worry, and anxiety / panic. Reduce compulsive behaviors by utilizing coping skills discussed. Patient to continue take medications as prescribed and report any concerns to prescribing provider.  Assessment of progress:  progressing .  Waldron Session, [facility]

## 2020-11-11 ENCOUNTER — Other Ambulatory Visit: Payer: Self-pay

## 2020-11-11 ENCOUNTER — Ambulatory Visit: Payer: 59 | Admitting: Mental Health

## 2020-11-11 DIAGNOSIS — F429 Obsessive-compulsive disorder, unspecified: Secondary | ICD-10-CM | POA: Diagnosis not present

## 2020-11-11 NOTE — Progress Notes (Signed)
     [facility] Counselor/Therapist Progress Note   Patient ID: Michaela Pena, MRN: [id number]  Date: 11/11/20  Timespent: 46 minutes    Treatment Type: Individual therapy  Mental Status Exam:    Appearance:   casual  Behavior:  wnl  Motor:  wnl  Speech/Language:   Clear and Coherent  Affect:  Full range  Mood:  anxious, pleasant  Thought process:  Coherent and Relevant, goal directed  Thought content:    WDL, no SI/HI  Perceptual disturbances:    none  Orientation:  Full (Time, Place, and Person)  Attention:  Good  Concentration:  good  Memory:  WNL  Fund of knowledge:   Good  Insight:    Good  Judgment:   Good  Impulse Control:  developing   Reported Symptoms: Daily anxiety, obsessive thoughts, compulsive behavior, panic attacks  Risk Assessment: Danger to Self:  No Self-injurious Behavior: No Danger to Others: No Duty to Warn:no Physical Aggression / Violence:No  Access to Firearms a concern: No  Gang Involvement:No   Patient / guardian was educated about steps to take if suicide or homicide risk level increases between visits. While future psychiatric events cannot be accurately predicted, the patient does not currently require acute inpatient psychiatric care and does not currently meet [facility] involuntary commitment criteria.   Subjective:  Patient presents for session on time.  Patient shared recent progress where she stated that she had a discussion with her mother recently where her mother raised her rent up to $800 per month.  Patient stated that it was shocking as she was unaware that her mother was going to have this discussion with her however, patient stated that she can handle this financially and understands her mother's rationale as she now is renting an apartment out of state which adds to her overall bills.  Patient also continues to engage in weekly Shidduch counseling but recently restarted.  Patient biweekly and plans to discuss  further with.  She continues to not want to engage in the process however, does so to avoid any relational conflict with her mother.  She stated that her mother currently living in her other apartment for half a month, this will give her some desire to independence  while continuing to live at home.     Interventions: Motivational interviewing, supportive therapy  Diagnosis: Obsessive-compulsive disorder, unspecified type  long-term goal:   Reduce overall level, frequency, and intensity of the feelings of anxiety and obsessive-compulsive behaviors so that daily functioning is not impaired.  Short-term goal:  Verbalize an understanding of how thoughts, physical feelings, and behavioral actions contribute to anxiety and its treatment. Verbalize an understanding of the role that fearful thinking plays in creating fears, excessive worry, and anxiety / panic. Reduce compulsive behaviors by utilizing coping skills discussed. Patient to continue take medications as prescribed and report any concerns to prescribing provider.  Assessment of progress:  progressing .  Waldron Session, [facility]

## 2020-11-25 ENCOUNTER — Other Ambulatory Visit: Payer: Self-pay

## 2020-11-25 ENCOUNTER — Ambulatory Visit: Payer: 59 | Admitting: Mental Health

## 2020-11-25 DIAGNOSIS — F411 Generalized anxiety disorder: Secondary | ICD-10-CM | POA: Diagnosis not present

## 2020-11-25 NOTE — Progress Notes (Signed)
[facility] Counselor/Therapist Progress Note   Patient ID: Michaela Pena, MRN: [id number]  Date: 11/25/20  Timespent: 50 minutes    Treatment Type: Individual therapy  Mental Status Exam:    Appearance:   casual  Behavior:  wnl  Motor:  wnl  Speech/Language:   Clear and Coherent  Affect:  Full range  Mood:  anxious, pleasant  Thought process:  Coherent and Relevant, goal directed  Thought content:    WDL, no SI/HI  Perceptual disturbances:    none  Orientation:  Full (Time, Place, and Person)  Attention:  Good  Concentration:  good  Memory:  WNL  Fund of knowledge:   Good  Insight:    Good  Judgment:   Good  Impulse Control:  developing   Reported Symptoms: Daily anxiety, obsessive thoughts, compulsive behavior, panic attacks  Risk Assessment: Danger to Self:  No Self-injurious Behavior: No Danger to Others: No Duty to Warn:no Physical Aggression / Violence:No  Access to Firearms a concern: No  Gang Involvement:No   Patient / guardian was educated about steps to take if suicide or homicide risk level increases between visits. While future psychiatric events cannot be accurately predicted, the patient does not currently require acute inpatient psychiatric care and does not currently meet [facility] involuntary commitment criteria.   Subjective:  Patient presents for session on time.  She shared recent progress, how she is now paying more rent.  She stated that her mother has gone forward with getting an apartment in another city and plans to live between their home and the apartment.  Patient stated that her mother is no longer seeing her boyfriend and how patient is okay with this as she was not particularly fond of him.  She stated that she followed through with talking to her Shidduch counselor and now is only seeing her 2 times per month as opposed to 4 times per month; patient remains unmotivated to go forward with Shidduch and has also discontinued  going more frequently to synagogue.  Utilizing motivational interviewing, patient identified the need to find other employment as she stated that her current job has been inconsistent with the hours agreed upon when she was hired, also, that her boss (the owner) made a disparaging comment toward her a few days ago questioning patient "are you on the autistic spectrum", then referring to how she interacted with customers at the job.  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  Patient stated she prefers this as she considers herself introverted and would rather not deal with the public so frequently.  She identified also the need to look for alternative employment for a better schedule.  Patient stated that she was not highly distressed by the comment her boss made but felt it inappropriate and unprofessional.    Interventions: Motivational interviewing, supportive therapy  Diagnosis: Obsessive-compulsive disorder, unspecified type  long-term goal:   Reduce overall level, frequency, and intensity of the feelings of anxiety and obsessive-compulsive behaviors so that daily functioning is not impaired.  Short-term goal:  Verbalize an understanding of how thoughts, physical feelings, and behavioral actions contribute to anxiety and its treatment. Verbalize an understanding of the role that fearful thinking plays in creating fears, excessive worry, and anxiety / panic. Reduce compulsive behaviors by utilizing coping skills discussed. Patient to continue take medications as prescribed and report any concerns to prescribing provider.  Assessment of progress:  progressing .  Anson [place], [facility]

## 2020-12-09 ENCOUNTER — Other Ambulatory Visit: Payer: Self-pay

## 2020-12-09 ENCOUNTER — Ambulatory Visit: Payer: 59 | Admitting: Mental Health

## 2020-12-09 DIAGNOSIS — F411 Generalized anxiety disorder: Secondary | ICD-10-CM

## 2020-12-09 NOTE — Progress Notes (Signed)
     [facility] Counselor/Therapist Progress Note   Patient ID: Michaela Pena, MRN: [id number]  Date: 12/09/20  Timespent: 50 minutes    Treatment Type: Individual therapy  Mental Status Exam:    Appearance:   casual  Behavior:  wnl  Motor:  wnl  Speech/Language:   Clear and Coherent  Affect:  Full range  Mood:  anxious, pleasant  Thought process:  Coherent and Relevant, goal directed  Thought content:    WDL, no SI/HI  Perceptual disturbances:    none  Orientation:  Full (Time, Place, and Person)  Attention:  Good  Concentration:  good  Memory:  WNL  Fund of knowledge:   Good  Insight:    Good  Judgment:   Good  Impulse Control:  developing   Reported Symptoms: Daily anxiety, obsessive thoughts, compulsive behavior, panic attacks  Risk Assessment: Danger to Self:  No Self-injurious Behavior: No Danger to Others: No Duty to Warn:no Physical Aggression / Violence:No  Access to Firearms a concern: No  Gang Involvement:No   Patient / guardian was educated about steps to take if suicide or homicide risk level increases between visits. While future psychiatric events cannot be accurately predicted, the patient does not currently require acute inpatient psychiatric care and does not currently meet [facility] involuntary commitment criteria.   Subjective:  Patient presents for session on time.  She shared recent progress and events, how her grandfather has been more ill recently and is being moved to hospice.  She states she is close with her grandfather however her mother is taking it very hard as they are close.  She stated her grandmother is currently in a memory care facility.  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  She stated that her schedule can be changed within a day, how this can be very inconvenient for having consistent hours.  She states she also  continues to work her other part-time job, at this point only 9 hours/week and is considering looking at other options.  She states she has decided to complete her BA in history over the summer semester.  Through guided discovery, patient had difficulty identifying significant motivation toward long-term happiness, identifying and sharing "what is the point?".  She denied being severely depressed, more just feeling indifferent.  Encouraged her to further reflect on what mattered most in her life currently and also potentially in the future between sessions.   Interventions: Motivational interviewing, supportive therapy  Diagnosis: Obsessive-compulsive disorder, unspecified type  long-term goal:   Reduce overall level, frequency, and intensity of the feelings of anxiety and obsessive-compulsive behaviors so that daily functioning is not impaired.  Short-term goal:  Verbalize an understanding of how thoughts, physical feelings, and behavioral actions contribute to anxiety and its treatment. Verbalize an understanding of the role that fearful thinking plays in creating fears, excessive worry, and anxiety / panic. Reduce compulsive behaviors by utilizing coping skills discussed. Patient to continue take medications as prescribed and report any concerns to prescribing provider.  Assessment of progress:  progressing .  Waldron Session, [facility]

## 2020-12-23 ENCOUNTER — Ambulatory Visit: Payer: 59 | Admitting: Mental Health

## 2020-12-23 ENCOUNTER — Other Ambulatory Visit: Payer: Self-pay

## 2020-12-23 ENCOUNTER — Other Ambulatory Visit: Payer: Self-pay | Admitting: Obstetrics and Gynecology

## 2020-12-23 DIAGNOSIS — F411 Generalized anxiety disorder: Secondary | ICD-10-CM | POA: Diagnosis not present

## 2020-12-23 DIAGNOSIS — N63 Unspecified lump in unspecified breast: Secondary | ICD-10-CM

## 2020-12-23 NOTE — Progress Notes (Signed)
     [facility] Counselor/Therapist Progress Note   Patient ID: Michaela Pena, MRN: [id number]  Date: 12/23/20  Timespent: 50 minutes    Treatment Type: Individual therapy  Mental Status Exam:    Appearance:   casual  Behavior:  wnl  Motor:  wnl  Speech/Language:   Clear and Coherent  Affect:  Full range  Mood:  anxious, pleasant  Thought process:  Coherent and Relevant, goal directed  Thought content:    WDL, no SI/HI  Perceptual disturbances:    none  Orientation:  Full (Time, Place, and Person)  Attention:  Good  Concentration:  good  Memory:  WNL  Fund of knowledge:   Good  Insight:    Good  Judgment:   Good  Impulse Control:  developing   Reported Symptoms: Daily anxiety, obsessive thoughts, compulsive behavior, panic attacks  Risk Assessment: Danger to Self:  No Self-injurious Behavior: No Danger to Others: No Duty to Warn:no Physical Aggression / Violence:No  Access to Firearms a concern: No  Gang Involvement:No   Patient / guardian was educated about steps to take if suicide or homicide risk level increases between visits. While future psychiatric events cannot be accurately predicted, the patient does not currently require acute inpatient psychiatric care and does not currently meet [facility] involuntary commitment criteria.   Subjective:  Patient presents for session on time.  Patient shared recent events, how her grandfather passed away recently and the family attended the funeral about 1 week ago.  Patient states she is doing "fine" referring to this passing, and patient appears to be coping well.  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  Patient has wondered if she has lost her job, also theorizes that the owner could have additional employees during the Christmas season which is  reducing her hours.  We reviewed some content from previous session referring to her comment about her life not having a "point".  She elaborated further to state that there is not much of a point to life anyway and has felt this way since adolescence.  She did acknowledge that she would like to have more joy and happiness in her life but had difficulty identifying specifics.  She stated that she had not followed through with giving this thought between sessions.  We encouraged her to do so for further discussion next visit.  He is most of the session continuing to facilitate patient identifying some wants and needs in her life where she did identify wanting to have better friendships in the future, independence and her dog.  Interventions: Motivational interviewing, supportive therapy  Diagnosis: Obsessive-compulsive disorder, unspecified type  long-term goal:   Utilize coping as discussed in session, utilize support system, continue to maintain functioning about working her part-time jobs to pay assigned bills.  Short-term goal:  Verbalize an understanding of how thoughts, physical feelings, and behavioral actions contribute to anxiety and its treatment. Verbalize an understanding of the role that fearful thinking plays in creating fears, excessive worry, and anxiety / panic. Reduce compulsive behaviors by utilizing coping skills discussed. Patient to continue take medications as prescribed and report any concerns to prescribing provider.  Assessment of progress:  progressing .  Michaela Pena Session, Michaela Pena

## 2021-01-08 ENCOUNTER — Ambulatory Visit: Payer: 59 | Admitting: Mental Health

## 2021-01-20 ENCOUNTER — Ambulatory Visit (INDEPENDENT_AMBULATORY_CARE_PROVIDER_SITE_OTHER): Payer: BC Managed Care – PPO | Admitting: Mental Health

## 2021-01-20 ENCOUNTER — Other Ambulatory Visit: Payer: Self-pay

## 2021-01-20 DIAGNOSIS — F411 Generalized anxiety disorder: Secondary | ICD-10-CM | POA: Diagnosis not present

## 2021-01-20 NOTE — Progress Notes (Signed)
°     [facility] Counselor/Therapist Progress Note   Patient ID: Michaela Pena, MRN: [id number]  Date: 01/21/20  Timespent: 50 minutes    Treatment Type: Individual therapy  Mental Status Exam:    Appearance:   casual  Behavior:  wnl  Motor:  wnl  Speech/Language:   Clear and Coherent  Affect:  Full range  Mood:  anxious, pleasant  Thought process:  Coherent and Relevant, goal directed  Thought content:    WDL, no SI/HI  Perceptual disturbances:    none  Orientation:  Full (Time, Place, and Person)  Attention:  Good  Concentration:  good  Memory:  WNL  Fund of knowledge:   Good  Insight:    Good  Judgment:   Good  Impulse Control:  developing   Reported Symptoms: Daily anxiety, obsessive thoughts, compulsive behavior, panic attacks  Risk Assessment: Danger to Self:  No Self-injurious Behavior: No Danger to Others: No Duty to Warn:no Physical Aggression / Violence:No  Access to Firearms a concern: No  Gang Involvement:No   Patient / guardian was educated about steps to take if suicide or homicide risk level increases between visits. While future psychiatric events cannot be accurately predicted, the patient does not currently require acute inpatient psychiatric care and does not currently meet [facility] involuntary commitment criteria.   Subjective:  Patient presents for session on time.  Patient shared recent events, stated that her car broke down recently and she will probably have to buy another.  Patient still considers going back to school to complete her degree in history however, at this point she is uncertain as to when this would occur.  She has some motivation and through guided discovery, identified the need to have more daily purpose in her life as she will often find herself having excessive free time.  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  She plans to look for jobs between sessions as she has known that this has been needed for the last month or 2.  She stated she did not return to her last job due to not being contacted by her boss.  She continues to meet with her [place] counselor however, plans to discontinue their sessions as she does not want to go through the process albeit she knows this would be somewhat upsetting to her mother but, at this point patient identifies how she feels that she is more ready to make this change.  Interventions: Motivational interviewing, supportive therapy  Diagnosis: Obsessive-compulsive disorder, unspecified type  long-term goal:   Utilize coping as discussed in session, utilize support system, continue to maintain functioning about working her part-time jobs to pay assigned bills.  Short-term goal:  Verbalize an understanding of how thoughts, physical feelings, and behavioral actions contribute to anxiety and its treatment. Verbalize an understanding of the role that fearful thinking plays in creating fears, excessive worry, and anxiety / panic. Reduce compulsive behaviors by utilizing coping skills discussed. Patient to continue take medications as prescribed and report any concerns to prescribing provider.  Assessment of progress:  progressing .  Waldron Session, [facility]

## 2021-01-28 ENCOUNTER — Ambulatory Visit: Payer: 59

## 2021-02-03 ENCOUNTER — Ambulatory Visit (INDEPENDENT_AMBULATORY_CARE_PROVIDER_SITE_OTHER): Payer: BC Managed Care – PPO | Admitting: Mental Health

## 2021-02-03 DIAGNOSIS — F411 Generalized anxiety disorder: Secondary | ICD-10-CM

## 2021-02-03 NOTE — Addendum Note (Signed)
Addended by: Anson Oregon D on: 02/03/2021 01:00 PM   Modules accepted: Level of Service

## 2021-02-03 NOTE — Progress Notes (Addendum)
°     [facility] Counselor/Therapist Progress Note   Patient ID: Stasi Landmark, MRN: [id number]  Date: 02/04/20  Timespent: 32 minutes    Treatment Type: Individual therapy  Mental Status Exam:    Appearance:   casual  Behavior:  wnl  Motor:  wnl  Speech/Language:   Clear and Coherent  Affect:  Full range  Mood:  anxious, pleasant  Thought process:  Coherent and Relevant, goal directed  Thought content:    WDL, no SI/HI  Perceptual disturbances:    none  Orientation:  Full (Time, Place, and Person)  Attention:  Good  Concentration:  good  Memory:  WNL  Fund of knowledge:   Good  Insight:    Good  Judgment:   Good  Impulse Control:  developing   Reported Symptoms: Daily anxiety, obsessive thoughts, compulsive behavior, panic attacks  Risk Assessment: Danger to Self:  No Self-injurious Behavior: No Danger to Others: No Duty to Warn:no Physical Aggression / Violence:No  Access to Firearms a concern: No  Gang Involvement:No   Patient / guardian was educated about steps to take if suicide or homicide risk level increases between visits. While future psychiatric events cannot be accurately predicted, the patient does not currently require acute inpatient psychiatric care and does not currently meet [facility] involuntary commitment criteria.   Subjective:  Patient presents for session on time.  Patient shared recent changes, she stated that her car was able to get fixed therefore she will not have to purchase another.  She went on to share how, during her visit with her family friends out of state, she was set up on a blind date without her knowledge.  She stated the couple she was visiting is of the same faith and this is typical to occur albeit patient continues to not want to get married or even date at this point.  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  Patient stated that it leaves her in a state of stress, maintains levels of anxiety due to the lack of consistency.  She admitted she was feeling more stressed recently and this appears to be a component, albeit maybe not the only reason.  We encouraged patient to consider for further discussion next session.   Interventions: Motivational interviewing, supportive therapy  Diagnosis: Obsessive-compulsive disorder, unspecified type  long-term goal:   Utilize coping as discussed in session, utilize support system, continue to maintain functioning about working her part-time jobs to pay assigned bills.  Short-term goal:  Verbalize an understanding of how thoughts, physical feelings, and behavioral actions contribute to anxiety and its treatment. Verbalize an understanding of the role that fearful thinking plays in creating fears, excessive worry, and anxiety / panic. Reduce compulsive behaviors by utilizing coping skills discussed. Patient to continue take medications as prescribed and report any concerns to prescribing provider.  Assessment of progress:  progressing .  Anson Oregon, [facility]

## 2021-02-05 ENCOUNTER — Telehealth: Payer: Self-pay | Admitting: Psychiatry

## 2021-02-05 ENCOUNTER — Ambulatory Visit (INDEPENDENT_AMBULATORY_CARE_PROVIDER_SITE_OTHER): Payer: BC Managed Care – PPO | Admitting: Psychiatry

## 2021-02-05 ENCOUNTER — Encounter: Payer: Self-pay | Admitting: Psychiatry

## 2021-02-05 ENCOUNTER — Other Ambulatory Visit: Payer: Self-pay

## 2021-02-05 DIAGNOSIS — F411 Generalized anxiety disorder: Secondary | ICD-10-CM

## 2021-02-05 DIAGNOSIS — F3342 Major depressive disorder, recurrent, in full remission: Secondary | ICD-10-CM

## 2021-02-05 DIAGNOSIS — F900 Attention-deficit hyperactivity disorder, predominantly inattentive type: Secondary | ICD-10-CM | POA: Diagnosis not present

## 2021-02-05 DIAGNOSIS — F429 Obsessive-compulsive disorder, unspecified: Secondary | ICD-10-CM

## 2021-02-05 MED ORDER — AMPHETAMINE SULFATE 10 MG PO TABS: 10.0000 mg | ORAL_TABLET | Freq: Every morning | ORAL | 0 refills | Status: DC

## 2021-02-05 MED ORDER — DEXTROAMPHETAMINE SULFATE 10 MG PO TABS: ORAL_TABLET | ORAL | 0 refills | Status: DC

## 2021-02-05 MED ORDER — FLUOXETINE HCL 10 MG PO CAPS: ORAL_CAPSULE | ORAL | 1 refills | Status: DC

## 2021-02-05 NOTE — Telephone Encounter (Signed)
Pt informed

## 2021-02-05 NOTE — Progress Notes (Signed)
Michaela Pena [id number] 1995/08/05 26 y.o.  Subjective:   Patient ID:  Michaela Pena is a 26 y.o. (DOB 1995-09-28) female.  Chief Complaint:  Chief Complaint  Patient presents with   Anxiety   Depression   ADHD   Insomnia    HPI Michaela Pena presents to the office today for follow-up of anxiety, depression, ADHD, and insomnia. She  was last seen 10/25/19. Has been without medication for awhile. She reports periods "where I feel very good for about 5-6 months and then periods where I don't feel very good." She reports that she will then experience some anxiety and depression. She reports occ panic attacks. She reports increased obsessive thoughts and have become more excessive over the last 2 months. She reports that she has also had increased compulsions that have almost caused her to be late and having to wake up multiple times a night to check that the doors are locked. Reports feeling "a lot of stress" and has had a tension headache for the last month. She reports poor sleep with difficulty getting to sleep and predominantly difficulty staying asleep. Appetite has been stable. She reports recent weight gain. Reports that she may be experiencing "a little bit of depression." Energy and motivation have been low. Diminished interest in things. Denies SI.   She has been doing some contract work and has had difficulty sitting down to focus to work on this for longer than 20 minutes.   She continues to work as a Surveyor, minerals.  Continues to see Michaela Pena, [facility] for therapy.   Cannot remember how she responded to Prozac.    Past Psychiatric Medication Trials: Wellbutrin XL Sertraline Adderall XR- Effective, however unable to tolerate due to n/v Adderall immediate release- side effects Evekeo Alprazolam   Review of Systems:  Review of Systems  Cardiovascular:  Negative for palpitations.  Gastrointestinal:        Reports upset stomach secondary to anxiety.    Musculoskeletal:  Negative for gait problem.  Neurological:  Positive for headaches.  Psychiatric/Behavioral:         Please refer to HPI   Medications: I have reviewed the patient's current medications.  Current Outpatient Medications  Medication Sig Dispense Refill   ALPRAZolam (XANAX) 0.25 MG tablet Take 1/2-2 tabs po qd prn anxiety (Patient not taking: Reported on 02/05/2021) 30 tablet 1   [START ON 03/05/2021] Amphetamine Sulfate (EVEKEO) 10 MG TABS Take 10 mg by mouth every morning. 30 tablet 0   Amphetamine Sulfate (EVEKEO) 10 MG TABS Take 10 mg by mouth every morning. 30 tablet 0   FLUoxetine (PROZAC) 10 MG capsule Take 1 capsule daily for one week, then increase to 2 capsules daily 60 capsule 1   levothyroxine (SYNTHROID) 25 MCG tablet Take 25 mcg by mouth daily before breakfast. (Patient not taking: Reported on 02/05/2021)     No current facility-administered medications for this visit.    Medication Side Effects: Other: N/A  Allergies: No Known Allergies  Past Medical History:  Diagnosis Date   ADD (attention deficit disorder)    Anxiety    Thyroid disease     Past Medical History, Surgical history, Social history, and Family history were reviewed and updated as appropriate.   Please see review of systems for further details on the patient's review from today.   Objective:   Physical Exam:  BP 118/64    Pulse 95   Physical Exam Constitutional:      General: She is not  in acute distress. Musculoskeletal:        General: No deformity.  Neurological:     Mental Status: She is alert and oriented to person, place, and time.     Coordination: Coordination normal.  Psychiatric:        Attention and Perception: Attention and perception normal. She does not perceive auditory or visual hallucinations.        Mood and Affect: Mood is anxious and depressed. Affect is not labile, blunt, angry or inappropriate.        Speech: Speech normal.        Behavior: Behavior  normal. Behavior is cooperative.        Thought Content: Thought content normal. Thought content is not paranoid or delusional. Thought content does not include homicidal or suicidal ideation. Thought content does not include homicidal or suicidal plan.        Cognition and Memory: Cognition and memory normal.        Judgment: Judgment normal.     Comments: Insight intact    Lab Review:     Component Value Date/Time   NA 141 05/12/2019 1120   K 4.2 05/12/2019 1120   CL 107 05/12/2019 1120   CO2 27 05/12/2019 1120   GLUCOSE 94 05/12/2019 1120   BUN 12 05/12/2019 1120   CREATININE 0.82 05/12/2019 1120   CALCIUM 9.1 05/12/2019 1120   PROT 6.9 05/12/2019 1120   ALBUMIN 4.1 05/12/2019 1120   AST 15 05/12/2019 1120   ALT 11 05/12/2019 1120   ALKPHOS 85 05/12/2019 1120   BILITOT 0.4 05/12/2019 1120   GFRNONAA >60 05/12/2019 1120   GFRAA >60 05/12/2019 1120       Component Value Date/Time   WBC 5.8 05/12/2019 1120   RBC 4.48 05/12/2019 1120   HGB 13.3 05/12/2019 1120   HCT 40.8 05/12/2019 1120   PLT 232 05/12/2019 1120   MCV 91.1 05/12/2019 1120   MCH 29.7 05/12/2019 1120   MCHC 32.6 05/12/2019 1120   RDW 12.1 05/12/2019 1120   LYMPHSABS 1.8 05/12/2019 1120   MONOABS 0.4 05/12/2019 1120   EOSABS 0.1 05/12/2019 1120   BASOSABS 0.0 05/12/2019 1120    No results found for: POCLITH, LITHIUM   No results found for: PHENYTOIN, PHENOBARB, VALPROATE, CBMZ   .res Assessment: Plan:    Pt seen for 25 minutes and time spent discussing s/s and treatment plan. Discussed options for resuming therapy to include which medications to re-start or the option to gradually resume medications. She agrees to re-start of Prozac since she does not recall experiencing adverse effects with it in the past. She reports that she is unsure of benefit with Wellbutrin in the past. Discussed option of re-starting Prozac first and then considering re-starting Wellbutrin XL if needed in case mood and anxiety  s/s can be managed with Prozac alone. Pt is in agreement with this plan.  Will re-start Evekeo qd prn ADHD for pt to use while working on contract work that requires increased attention and focus. Discussed that insurance may require an alternative to [facility] before covering Evekeo and that Dexedrine may be sent in if this occurs since it is most similar to [employer].  She reports that she may still have some Alprazolam remaining and discussed that she could take Alprazolam prn if she experiences severe panic. Pt to follow-up in 5-6 weeks or sooner if clinically indicated. Patient advised to contact office with any questions, adverse effects, or acute worsening in signs and symptoms.  Michaela Pena was seen today for anxiety, depression, adhd and insomnia.  Diagnoses and all orders for this visit:  Attention deficit hyperactivity disorder (ADHD), predominantly inattentive type -     Amphetamine Sulfate (EVEKEO) 10 MG TABS; Take 10 mg by mouth every morning. -     Amphetamine Sulfate (EVEKEO) 10 MG TABS; Take 10 mg by mouth every morning.  Recurrent major depressive disorder, in full remission ([place]) -     FLUoxetine (PROZAC) 10 MG capsule; Take 1 capsule daily for one week, then increase to 2 capsules daily  Generalized anxiety disorder -     FLUoxetine (PROZAC) 10 MG capsule; Take 1 capsule daily for one week, then increase to 2 capsules daily  Obsessive-compulsive disorder, unspecified type -     FLUoxetine (PROZAC) 10 MG capsule; Take 1 capsule daily for one week, then increase to 2 capsules daily     Please see After Visit Summary for patient specific instructions.  Future Appointments  Date Time Provider [place]  02/11/2021  3:30 PM [facility] [place] 1 [facility]  02/17/2021  2:00 PM Anson Oregon, [facility] CP-CP None  03/03/2021  1:00 PM Anson Oregon, [facility] CP-CP None  03/12/2021  9:30 AM Thayer Headings, [facility]HNP CP-CP None  03/17/2021  1:00 PM Anson Oregon, [facility]  CP-CP None  03/31/2021  1:00 PM Anson Oregon, [facility] CP-CP None  04/14/2021  1:00 PM Anson Oregon, [facility] CP-CP None    No orders of the defined types were placed in this encounter.   -------------------------------

## 2021-02-05 NOTE — Telephone Encounter (Signed)
Please let her know the other med was sent and to call office if that is cost-prohibitive.

## 2021-02-05 NOTE — Telephone Encounter (Signed)
Michaela Pena called to report that the [name] is too expensive.  Please call in the other medication you discussed.  [facility] [address]

## 2021-02-05 NOTE — Telephone Encounter (Signed)
Spoke to pharmacy,med is $66 which is pt's co pay and they can't afford

## 2021-02-11 ENCOUNTER — Ambulatory Visit
Admission: RE | Admit: 2021-02-11 | Discharge: 2021-02-11 | Disposition: A | Discharge status: Home / Self Care | Payer: BC Managed Care – PPO | Source: Ambulatory Visit | Attending: Obstetrics and Gynecology | Admitting: Obstetrics and Gynecology

## 2021-02-11 ENCOUNTER — Other Ambulatory Visit: Payer: Self-pay | Admitting: Obstetrics and Gynecology

## 2021-02-11 ENCOUNTER — Other Ambulatory Visit: Payer: Self-pay

## 2021-02-11 DIAGNOSIS — N63 Unspecified lump in unspecified breast: Secondary | ICD-10-CM

## 2021-02-11 DIAGNOSIS — N6002 Solitary cyst of left breast: Secondary | ICD-10-CM | POA: Diagnosis not present

## 2021-02-17 ENCOUNTER — Ambulatory Visit: Payer: BC Managed Care – PPO | Admitting: Mental Health

## 2021-02-17 ENCOUNTER — Other Ambulatory Visit: Payer: Self-pay

## 2021-02-17 DIAGNOSIS — F900 Attention-deficit hyperactivity disorder, predominantly inattentive type: Secondary | ICD-10-CM | POA: Diagnosis not present

## 2021-02-17 DIAGNOSIS — F411 Generalized anxiety disorder: Secondary | ICD-10-CM | POA: Diagnosis not present

## 2021-02-17 NOTE — Progress Notes (Signed)
°     [facility] Counselor/Therapist Progress Note   Patient ID: Michaela Pena, MRN: [id number]  Date: 02/18/20  Timespent: 50 minutes    Treatment Type: Individual therapy  Mental Status Exam:    Appearance:   casual  Behavior:  wnl  Motor:  wnl  Speech/Language:   Clear and Coherent  Affect:  Full range  Mood:  anxious, pleasant  Thought process:  Coherent and Relevant, goal directed  Thought content:    WDL, no SI/HI  Perceptual disturbances:    none  Orientation:  Full (Time, Place, and Person)  Attention:  Good  Concentration:  good  Memory:  WNL  Fund of knowledge:   Good  Insight:    Good  Judgment:   Good  Impulse Control:  developing   Reported Symptoms: Daily anxiety, obsessive thoughts, compulsive behavior, panic attacks  Risk Assessment: Danger to Self:  No Self-injurious Behavior: No Danger to Others: No Duty to Warn:no Physical Aggression / Violence:No  Access to Firearms a concern: No  Gang Involvement:No   Patient / guardian was educated about steps to take if suicide or homicide risk level increases between visits. While future psychiatric events cannot be accurately predicted, the patient does not currently require acute inpatient psychiatric care and does not currently meet [facility] involuntary commitment criteria.   Subjective:  Patient presents for session on time.  Assess process progress.  Reports increased irritability and feeling more stressed. She had difficulty identifying causes for the increase feelings of agitation, irritability. We discussed how lack of sleep can contribute to the symptoms, which has been impaired over the last few weeks. She stated that she is problem sleeping, getting about 4 hours last night, the night before that about 2 hours. Reasons for the lack of sleep and other potential factors for increased stress levels were difficult for her to identify. She stated that she's been more irritated with some of her  friendships, not that this is a cause for her stress recently. She shared details related. Denies feeling severely depressed however, indicates that she is just going through the day with no real experiences of happiness. Encouraged her to identify factors that may allow her to find happiness between sessions; she did state she needs a more consistent schedule, to get up earlier in the day. 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 She stated she has started back on fluoxetine and plans and plans to attend her coming bed management and point management appointment with Thayer Headings, Eulas Post, NP.      Interventions: Motivational interviewing, supportive therapy  Diagnosis: Obsessive-compulsive disorder, unspecified type  long-term goal:   Utilize coping as discussed in session, utilize support system, continue to maintain functioning about working her part-time jobs to pay assigned bills.  Short-term goal:  Verbalize an understanding of how thoughts, physical feelings, and behavioral actions contribute to anxiety and its treatment. Verbalize an understanding of the role that fearful thinking plays in creating fears, excessive worry, and anxiety / panic. Reduce compulsive behaviors by utilizing coping skills discussed. Patient to continue take medications as prescribed and report any concerns to prescribing provider.  Assessment of progress:  progressing .  Anson Oregon, [facility]

## 2021-02-25 ENCOUNTER — Telehealth: Payer: Self-pay | Admitting: Psychiatry

## 2021-02-25 NOTE — Telephone Encounter (Signed)
Elta called and is not sleeping well. Is there something she can get to help her sleep? Her phone number is [phone].  Pharmacy is:  [id number] - [place], [place] - [address] AT [facility] [address] [facility] [address]  Phone:  [phone]  Fax:  [phone]

## 2021-02-26 NOTE — Telephone Encounter (Signed)
LVM to rtc 

## 2021-02-27 ENCOUNTER — Other Ambulatory Visit: Payer: Self-pay | Admitting: Psychiatry

## 2021-02-27 DIAGNOSIS — F411 Generalized anxiety disorder: Secondary | ICD-10-CM

## 2021-02-27 DIAGNOSIS — F3342 Major depressive disorder, recurrent, in full remission: Secondary | ICD-10-CM

## 2021-02-27 DIAGNOSIS — F429 Obsessive-compulsive disorder, unspecified: Secondary | ICD-10-CM

## 2021-02-28 MED ORDER — TRAZODONE HCL 100 MG PO TABS: ORAL_TABLET | ORAL | 1 refills | Status: DC

## 2021-02-28 NOTE — Telephone Encounter (Signed)
Pt informed

## 2021-02-28 NOTE — Telephone Encounter (Signed)
Please let her know that Trazodone prn was sent to her pharmacy. She can take nightly or only as needed. Recommend starting with a 1/2 tab and then taking 1 tab the following night if a 1/2 is not effective.

## 2021-02-28 NOTE — Telephone Encounter (Signed)
Pt stated she has trouble staying asleep.She takes melatonin to help her fall asleep,but wakes up a couple hours later and is unable to sleep.She does not take her xanax at night.

## 2021-03-03 ENCOUNTER — Other Ambulatory Visit: Payer: Self-pay

## 2021-03-03 ENCOUNTER — Ambulatory Visit (INDEPENDENT_AMBULATORY_CARE_PROVIDER_SITE_OTHER): Payer: BC Managed Care – PPO | Admitting: Mental Health

## 2021-03-03 DIAGNOSIS — F411 Generalized anxiety disorder: Secondary | ICD-10-CM | POA: Diagnosis not present

## 2021-03-03 NOTE — Progress Notes (Signed)
°     [facility] Counselor/Therapist Progress Note   Patient ID: Michaela Pena, MRN: [id number]  Date: 03/03/20  Timespent: 51 minutes    Treatment Type: Individual therapy  Mental Status Exam:    Appearance:   casual  Behavior:  wnl  Motor:  wnl  Speech/Language:   Clear and Coherent  Affect:  Full range  Mood:  anxious, pleasant  Thought process:  Coherent and Relevant, goal directed  Thought content:    WDL, no SI/HI  Perceptual disturbances:    none  Orientation:  Full (Time, Place, and Person)  Attention:  Good  Concentration:  good  Memory:  WNL  Fund of knowledge:   Good  Insight:    Good  Judgment:   Good  Impulse Control:  developing   Reported Symptoms: Daily anxiety, obsessive thoughts, compulsive behavior, panic attacks  Risk Assessment: Danger to Self:  No Self-injurious Behavior: No Danger to Others: No Duty to Warn:no Physical Aggression / Violence:No  Access to Firearms a concern: No  Gang Involvement:No   Patient / guardian was educated about steps to take if suicide or homicide risk level increases between visits. While future psychiatric events cannot be accurately predicted, the patient does not currently require acute inpatient psychiatric care and does not currently meet [facility] involuntary commitment criteria.   Subjective:  Patient presents for session on time.  Patient shared recent events.  She stated that her mother had mentioned to her that she is considering doing some construction to their home and making a duplex.  She stated that patient could live there and pay rent and they would have another 20.  She stated her mother continues to make trips to [place] for a few weeks at a time, continues to state that she might leave there at some point.  Patient stated that if that were to occur in the next few years she can see herself moving up to [place] as well.  She stated that she tries not to stressed or anxious in what her  mother may say, sharing how she tends to have ideas that may not actually occur.  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  Patient stated this will be helpful as she needs to be busier than she was recently.  Discussed if she is engaged in any interests that she enjoys, reviewed some content from last session related to obtaining some feelings of happiness.  Patient stated that she focuses more on the moment versus future happiness, expressing more contentment recently.   Interventions: Motivational interviewing, supportive therapy  Diagnosis: Obsessive-compulsive disorder, unspecified type  long-term goal:   Utilize coping as discussed in session, utilize support system, continue to maintain functioning about working her part-time jobs to pay assigned bills.  Short-term goal:  Verbalize an understanding of how thoughts, physical feelings, and behavioral actions contribute to anxiety and its treatment. Verbalize an understanding of the role that fearful thinking plays in creating fears, excessive worry, and anxiety / panic. Reduce compulsive behaviors by utilizing coping skills discussed. Patient to continue take medications as prescribed and report any concerns to prescribing provider.  Assessment of progress:  progressing .  Waldron Session, [facility]

## 2021-03-12 ENCOUNTER — Other Ambulatory Visit: Payer: Self-pay

## 2021-03-12 ENCOUNTER — Encounter: Payer: Self-pay | Admitting: Psychiatry

## 2021-03-12 ENCOUNTER — Ambulatory Visit: Payer: BC Managed Care – PPO | Admitting: Psychiatry

## 2021-03-12 VITALS — BP 95/60 | HR 84

## 2021-03-12 DIAGNOSIS — F429 Obsessive-compulsive disorder, unspecified: Secondary | ICD-10-CM

## 2021-03-12 DIAGNOSIS — F411 Generalized anxiety disorder: Secondary | ICD-10-CM | POA: Diagnosis not present

## 2021-03-12 DIAGNOSIS — F900 Attention-deficit hyperactivity disorder, predominantly inattentive type: Secondary | ICD-10-CM

## 2021-03-12 DIAGNOSIS — F3342 Major depressive disorder, recurrent, in full remission: Secondary | ICD-10-CM

## 2021-03-12 DIAGNOSIS — G4700 Insomnia, unspecified: Secondary | ICD-10-CM | POA: Diagnosis not present

## 2021-03-12 MED ORDER — HYDROXYZINE PAMOATE 25 MG PO CAPS: 25.0000 mg | ORAL_CAPSULE | Freq: Every evening | ORAL | 0 refills | Status: DC | PRN

## 2021-03-12 MED ORDER — DEXTROAMPHETAMINE SULFATE 10 MG PO TABS: ORAL_TABLET | ORAL | 0 refills | Status: DC

## 2021-03-12 MED ORDER — FLUOXETINE HCL 40 MG PO CAPS: 40.0000 mg | ORAL_CAPSULE | Freq: Every day | ORAL | 2 refills | Status: DC

## 2021-03-12 NOTE — Progress Notes (Signed)
Aris Lot ?[id number] ?[birth year] ?26 y.o. ? ?Subjective:  ? ?Patient ID:  Michaela Pena is a 26 y.o. (DOB [birth year]) female. ? ?Chief Complaint:  ?Chief Complaint  ?Patient presents with  ? Follow-up  ?  Anxiety, depression, insomnia, and ADHD  ? ? ?HPI ?Aris Lot presents to the office today for follow-up of anxiety, depression, insomnia, and ADHD. She reports that she has more energy and feels "a bit more level headed." Denies depressed mood. She has not seen a significant change in anxiety. Continued worry. She reports obsessive thoughts may be a little less. Continued checking behaviors. Denies recent panic attacks. She reports that tension headache has partially resolved. Sleeping a few hours and then wakes ups and then falls back asleep eventually. Appetite has been ok. Some slight improvement in motivation. Diminished interest in things. Concentration has been ok. Some difficulty with sitting still for long periods and that medication has helped some with this. Denies SI.  ? ?She reports that she has been taking medication consistently.  ?  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  ? ?Past Psychiatric Medication Trials: ?Wellbutrin XL ?Sertraline ?Adderall XR- Effective, however unable to tolerate due to n/v ?Adderall immediate release- side effects ?Evekeo ?Dextroamphetamine ?Alprazolam-effective ?Trazodone- caused nightmares in the past. Effective. ? ?Review of Systems:  ?Review of Systems  ?Cardiovascular:  Negative for palpitations.  ?Gastrointestinal: Negative.   ?Musculoskeletal:  Negative for gait problem.  ?Neurological:   ?     Improved headache  ?Psychiatric/Behavioral:    ?     Please refer to HPI  ? ?Medications: I have reviewed the patient's current medications. ? ?Current Outpatient Medications  ?Medication Sig Dispense Refill  ? [START ON 04/09/2021] dextroamphetamine (DEXTROSTAT) 10 MG tablet Take 1/2-1 tab po qd prn 30 tablet 0  ? hydrOXYzine  (VISTARIL) 25 MG capsule Take 1 capsule (25 mg total) by mouth at bedtime as needed. 30 capsule 0  ? ALPRAZolam (XANAX) 0.25 MG tablet Take 1/2-2 tabs po qd prn anxiety (Patient not taking: Reported on 02/05/2021) 30 tablet 1  ? dextroamphetamine (DEXTROSTAT) 10 MG tablet Take 1/2-1 tab po qd prn 30 tablet 0  ? FLUoxetine (PROZAC) 40 MG capsule Take 1 capsule (40 mg total) by mouth daily. 30 capsule 2  ? levothyroxine (SYNTHROID) 25 MCG tablet Take 25 mcg by mouth daily before breakfast. (Patient not taking: Reported on 02/05/2021)    ? traZODone (DESYREL) 100 MG tablet Take 1/2-1 tablet po QHS prn insomnia 30 tablet 1  ? ?No current facility-administered medications for this visit.  ? ? ?Medication Side Effects: None ? ?Allergies: No Known Allergies ? ?Past Medical History:  ?Diagnosis Date  ? ADD (attention deficit disorder)   ? Anxiety   ? Thyroid disease   ? ? ?Past Medical History, Surgical history, Social history, and Family history were reviewed and updated as appropriate.  ? ?Please see review of systems for further details on the patient's review from today.  ? ?Objective:  ? ?Physical Exam:  ?BP 95/60   Pulse 84  ? ?Physical Exam ?Constitutional:   ?   General: She is not in acute distress. ?Musculoskeletal:     ?   General: No deformity.  ?Neurological:  ?   Mental Status: She is alert and oriented to person, place, and time.  ?   Coordination: Coordination normal.  ?Psychiatric:     ?   Attention and Perception: Attention and perception normal. She does not perceive auditory  or visual hallucinations.     ?   Mood and Affect: Mood is anxious. Affect is not labile, blunt, angry or inappropriate.     ?   Speech: Speech normal.     ?   Behavior: Behavior normal.     ?   Thought Content: Thought content normal. Thought content is not paranoid or delusional. Thought content does not include homicidal or suicidal ideation. Thought content does not include homicidal or suicidal plan.     ?   Cognition and Memory:  Cognition and memory normal.     ?   Judgment: Judgment normal.  ?   Comments: Insight intact ?Mood presents as less anxious and less depressed compared to last exam.  ? ? ?Lab Review:  ?   ?Component Value Date/Time  ? NA 141 05/12/2019 1120  ? K 4.2 05/12/2019 1120  ? CL 107 05/12/2019 1120  ? CO2 27 05/12/2019 1120  ? GLUCOSE 94 05/12/2019 1120  ? BUN 12 05/12/2019 1120  ? CREATININE 0.82 05/12/2019 1120  ? CALCIUM 9.1 05/12/2019 1120  ? PROT 6.9 05/12/2019 1120  ? ALBUMIN 4.1 05/12/2019 1120  ? AST 15 05/12/2019 1120  ? ALT 11 05/12/2019 1120  ? ALKPHOS 85 05/12/2019 1120  ? BILITOT 0.4 05/12/2019 1120  ? GFRNONAA >60 05/12/2019 1120  ? GFRAA >60 05/12/2019 1120  ? ? ?   ?Component Value Date/Time  ? WBC 5.8 05/12/2019 1120  ? RBC 4.48 05/12/2019 1120  ? HGB 13.3 05/12/2019 1120  ? HCT 40.8 05/12/2019 1120  ? PLT 232 05/12/2019 1120  ? MCV 91.1 05/12/2019 1120  ? MCH 29.7 05/12/2019 1120  ? MCHC 32.6 05/12/2019 1120  ? RDW 12.1 05/12/2019 1120  ? LYMPHSABS 1.8 05/12/2019 1120  ? MONOABS 0.4 05/12/2019 1120  ? EOSABS 0.1 05/12/2019 1120  ? BASOSABS 0.0 05/12/2019 1120  ? ? ?No results found for: POCLITH, LITHIUM  ? ?No results found for: PHENYTOIN, PHENOBARB, VALPROATE, CBMZ  ? ?.res ?Assessment: Plan:   ?Pt seen for 25 minutes and time spent discussing potential benefits, risks, and side effects of increasing Prozac to 40 mg po qd since she has experienced a partial improvement in mood and anxiety s/s with 20 mg dose without side effects. Also discussed potential benefits, risks, and side effects of Hydroxyzine for insomnia since she reports apprehension with taking Trazodone since it caused nightmares in the past. Pt agrees to trial of Hydroxyzine 25 mg po QHS prn insomnia.  ?Continue Dextroamphetamine 10 mg 1/2-1 tab po qd prn. ?Recommend continuing psychotherapy with Elio Forget, [facility]   ?Pt to follow-up in 2 months or sooner if clinically indicated.  ?Patient advised to contact office with any questions,  adverse effects, or acute worsening in signs and symptoms. ? ? ?Michaela Pena was seen today for follow-up. ? ?Diagnoses and all orders for this visit: ? ?Insomnia, unspecified type ?-     hydrOXYzine (VISTARIL) 25 MG capsule; Take 1 capsule (25 mg total) by mouth at bedtime as needed. ? ?Recurrent major depressive disorder, in full remission (HCC) ?-     FLUoxetine (PROZAC) 40 MG capsule; Take 1 capsule (40 mg total) by mouth daily. ? ?Generalized anxiety disorder ?-     FLUoxetine (PROZAC) 40 MG capsule; Take 1 capsule (40 mg total) by mouth daily. ? ?Obsessive-compulsive disorder, unspecified type ?-     FLUoxetine (PROZAC) 40 MG capsule; Take 1 capsule (40 mg total) by mouth daily. ? ?Attention deficit hyperactivity disorder (ADHD), predominantly inattentive  type ?-     dextroamphetamine (DEXTROSTAT) 10 MG tablet; Take 1/2-1 tab po qd prn ?-     dextroamphetamine (DEXTROSTAT) 10 MG tablet; Take 1/2-1 tab po qd prn ? ?  ? ?Please see After Visit Summary for patient specific instructions. ? ?Future Appointments  ?Date Time Provider Department Center  ?03/17/2021  1:00 PM Waldron Session, [facility] CP-CP None  ?03/31/2021  1:00 PM Waldron Session, [facility] CP-CP None  ?04/14/2021  1:00 PM Waldron Session, [facility] CP-CP None  ?05/01/2021  1:00 PM Waldron Session, [facility] CP-CP None  ?05/12/2021  1:00 PM Waldron Session, [facility] CP-CP None  ?05/13/2021 10:30 AM Corie Chiquito, PMHNP CP-CP None  ?08/13/2021  3:30 PM [facility] [place] 1 [facility]  ? ? ?No orders of the defined types were placed in this encounter. ? ? ?------------------------------- ?

## 2021-03-17 ENCOUNTER — Ambulatory Visit (INDEPENDENT_AMBULATORY_CARE_PROVIDER_SITE_OTHER): Payer: BC Managed Care – PPO | Admitting: Mental Health

## 2021-03-17 ENCOUNTER — Other Ambulatory Visit: Payer: Self-pay

## 2021-03-17 DIAGNOSIS — F3342 Major depressive disorder, recurrent, in full remission: Secondary | ICD-10-CM | POA: Diagnosis not present

## 2021-03-17 NOTE — Progress Notes (Signed)
?     [facility] Counselor/Therapist Progress Note ? ? ?Patient ID: Michaela Pena, MRN: [id number] ? ?Date:  03/17/20 ? ?Timespent: 50 minutes   ? ?Treatment Type: Individual therapy ? ?Mental Status Exam: ?  ? ?Appearance:   casual  ?Behavior:  wnl  ?Motor:  wnl  ?Speech/Language:   Clear and Coherent  ?Affect:  Full range  ?Mood:  anxious, pleasant  ?Thought process:  Coherent and Relevant, goal directed  ?Thought content:    WDL, no SI/HI  ?Perceptual disturbances:    none  ?Orientation:  Full (Time, Place, and Person)  ?Attention:  Good  ?Concentration:  good  ?Memory:  WNL  ?Fund of knowledge:   Good  ?Insight:    Good  ?Judgment:   Good  ?Impulse Control:  developing  ? ?Reported Symptoms: Daily anxiety, obsessive thoughts, compulsive behavior, panic attacks ? ?Risk Assessment: ?Danger to Self:  No ?Self-injurious Behavior: No ?Danger to Others: No ?Duty to Warn:no ?Physical Aggression / Violence:No  ?Access to Firearms a concern: No  ?Gang Involvement:No   ?Patient / guardian was educated about steps to take if suicide or homicide risk level increases between visits. ?While future psychiatric events cannot be accurately predicted, the patient does not currently require acute inpatient psychiatric care and does not currently meet [facility] involuntary commitment criteria.  ? ?Subjective:  ?Patient presents for session on time.  Assess progress.  She stated that she continues to have problems getting enough sleep at night, averaging about 4 to 5 hours.  She stated last night, she went to sleep around 5 AM, had to wake up at 8 AM to go to work.  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  She stated that she along with her mother and sisters plan to visit his room for 2 weeks over the summer which she looks forward to.  She stated the pressure to engage in the [place] process has decreased, has had no pressure from her mother recently, talks to her  grandmother about every 2 weeks who is the only person who may mention it to her.  She reports ongoing feelings of having a depressed mood, low motivation less enjoyment with pleasurable interests as compared to the past.  She had difficulty identifying specific reasons and we encouraged her to consider between sessions, possibly journal etc. ? ? ?Interventions: Motivational interviewing, supportive therapy ? ?Diagnosis: Obsessive-compulsive disorder, unspecified type ? ?long-term goal:   ?Utilize coping as discussed in session, utilize support system, continue to maintain functioning about working her part-time jobs to pay assigned bills. ? ?Short-term goal:  ?Verbalize an understanding of how thoughts, physical feelings, and behavioral actions contribute to anxiety and its treatment. ?Verbalize an understanding of the role that fearful thinking plays in creating fears, excessive worry, and anxiety / panic. ?Reduce compulsive behaviors by utilizing coping skills discussed. ?Patient to continue take medications as prescribed and report any concerns to prescribing provider. ? ?Assessment of progress:  progressing ?. ? ?Waldron Session, [facility] ?

## 2021-03-22 ENCOUNTER — Other Ambulatory Visit: Payer: Self-pay | Admitting: Psychiatry

## 2021-03-31 ENCOUNTER — Other Ambulatory Visit: Payer: Self-pay

## 2021-03-31 ENCOUNTER — Ambulatory Visit (INDEPENDENT_AMBULATORY_CARE_PROVIDER_SITE_OTHER): Payer: BC Managed Care – PPO | Admitting: Mental Health

## 2021-03-31 DIAGNOSIS — F3342 Major depressive disorder, recurrent, in full remission: Secondary | ICD-10-CM

## 2021-03-31 NOTE — Progress Notes (Signed)
?   [facility] Counselor/Therapist Progress Note ? ? ?Patient ID: Franka Mcdannel, MRN: [id number] ? ?Date:  03/31/20 ? ?Timespent: 52 minutes   ? ?Treatment Type: Individual therapy ? ?Mental Status Exam: ?  ? ?Appearance:   casual  ?Behavior:  wnl  ?Motor:  wnl  ?Speech/Language:   Clear and Coherent  ?Affect:  Full range  ?Mood:  anxious, pleasant  ?Thought process:  Coherent and Relevant, goal directed  ?Thought content:    WDL, no SI/HI  ?Perceptual disturbances:    none  ?Orientation:  Full (Time, Place, and Person)  ?Attention:  Good  ?Concentration:  good  ?Memory:  WNL  ?Fund of knowledge:   Good  ?Insight:    Good  ?Judgment:   Good  ?Impulse Control:  developing  ? ?Reported Symptoms: Daily anxiety, obsessive thoughts, compulsive behavior, panic attacks ? ?Risk Assessment: ?Danger to Self:  No ?Self-injurious Behavior: No ?Danger to Others: No ?Duty to Warn:no ?Physical Aggression / Violence:No  ?Access to Firearms a concern: No  ?Gang Involvement:No   ?Patient / guardian was educated about steps to take if suicide or homicide risk level increases between visits. ?While future psychiatric events cannot be accurately predicted, the patient does not currently require acute inpatient psychiatric care and does not currently meet [facility] involuntary commitment criteria.  ? ?Subjective:  ?Patient presents for session on time.  She stated that she has continued to have minimal sleep, averaging about 4 to 5 hours per night.  She states she has not followed through on taking the medication prescribed to assist with her sleep, expressed some concerns of being "out of it" if she did take it.  States that she continues to have some irritability but feels this been less lately.  Her mother returned home from being gone for the last few weeks.  Assessed their relationship which she states is going "okay".  She went on to share how she is having some intrusive thoughts, an example was she will have to turn  off the lights which after having thoughts that the house might catch on fire.  Another example was if she ate some food she may feel that there is been poisoning.  She stated that she knows that it is not rational but the thoughts just occur.  We reviewed some content from previous sessions related to her having other behaviors such as checking her gas cap to ensure that it was closed, walking around her car to inspect it to ensure that its okay to drive.  She has discontinued checking her gas cap several months ago but continues to walk around her car.  When discussing if she tries to avoid engaging in the behaviors such as turning off the light switch, she states "I just give in"; referring to the compulsion to check it and if she did try, her anxiety would just increase.  We discussed the potential benefits of trying to discontinue some of the compulsive behaviors associated by utilizing thought blocking as well as reframing. ? ? ?Interventions: Motivational interviewing, supportive therapy ? ?Diagnosis: Obsessive-compulsive disorder, unspecified type ? ?long-term goal:   ?Utilize coping as discussed in session, utilize support system, continue to maintain functioning about working her part-time jobs to pay assigned bills. ? ?Short-term goal:  ?Verbalize an understanding of how thoughts, physical feelings, and behavioral actions contribute to anxiety and its treatment. ?Verbalize an understanding of the role that fearful thinking plays in creating fears, excessive worry, and anxiety / panic. ?Reduce compulsive  behaviors by utilizing coping skills discussed. ?Patient to continue take medications as prescribed and report any concerns to prescribing provider. ? ?Assessment of progress:  progressing ?. ? ?Anson Oregon, [facility] ?

## 2021-04-06 ENCOUNTER — Other Ambulatory Visit: Payer: Self-pay | Admitting: Psychiatry

## 2021-04-06 DIAGNOSIS — F429 Obsessive-compulsive disorder, unspecified: Secondary | ICD-10-CM

## 2021-04-06 DIAGNOSIS — F3342 Major depressive disorder, recurrent, in full remission: Secondary | ICD-10-CM

## 2021-04-06 DIAGNOSIS — F411 Generalized anxiety disorder: Secondary | ICD-10-CM

## 2021-04-13 ENCOUNTER — Other Ambulatory Visit: Payer: Self-pay | Admitting: Psychiatry

## 2021-04-13 DIAGNOSIS — G4700 Insomnia, unspecified: Secondary | ICD-10-CM

## 2021-04-13 DIAGNOSIS — F3342 Major depressive disorder, recurrent, in full remission: Secondary | ICD-10-CM

## 2021-04-13 DIAGNOSIS — F411 Generalized anxiety disorder: Secondary | ICD-10-CM

## 2021-04-13 DIAGNOSIS — F429 Obsessive-compulsive disorder, unspecified: Secondary | ICD-10-CM

## 2021-04-14 ENCOUNTER — Ambulatory Visit (INDEPENDENT_AMBULATORY_CARE_PROVIDER_SITE_OTHER): Payer: BC Managed Care – PPO | Admitting: Mental Health

## 2021-04-14 DIAGNOSIS — F411 Generalized anxiety disorder: Secondary | ICD-10-CM | POA: Diagnosis not present

## 2021-04-14 NOTE — Progress Notes (Signed)
?     [facility] Counselor/Therapist Progress Note ? ? ?Patient ID: Michaela Pena, MRN: [id number] ? ?Date:  04/14/20 ? ?Timespent: 54 minutes   ? ?Treatment Type: Individual therapy ? ?Mental Status Exam: ?  ? ?Appearance:   casual  ?Behavior:  wnl  ?Motor:  wnl  ?Speech/Language:   Clear and Coherent  ?Affect:  Full range  ?Mood:  anxious, pleasant  ?Thought process:  Coherent and Relevant, goal directed  ?Thought content:    WDL, no SI/HI  ?Perceptual disturbances:    none  ?Orientation:  Full (Time, Place, and Person)  ?Attention:  Good  ?Concentration:  good  ?Memory:  WNL  ?Fund of knowledge:   Good  ?Insight:    Good  ?Judgment:   Good  ?Impulse Control:  developing  ? ?Reported Symptoms: Daily anxiety, obsessive thoughts, compulsive behavior, panic attacks ? ?Risk Assessment: ?Danger to Self:  No ?Self-injurious Behavior: No ?Danger to Others: No ?Duty to Warn:no ?Physical Aggression / Violence:No  ?Access to Firearms a concern: No  ?Gang Involvement:No   ?Patient / guardian was educated about steps to take if suicide or homicide risk level increases between visits. ?While future psychiatric events cannot be accurately predicted, the patient does not currently require acute inpatient psychiatric care and does not currently meet [facility] involuntary commitment criteria.  ? ?Subjective:  ?Patient presents for session on time.  Patient shared recent events, her mother out of town again in [place].  Patient enjoys being home alone, sisters continue to be away at college.  She shared how she continues to struggle with getting adequate rest, sleeping about 4 to 5 hours per night.  She stated today, she dozed off while at work for a few seconds.  Encouraged her to consider taking her medications prescribed more consistently, stated she tried her medication to assist with sleep 1 night, states it was not that effective as she woke throughout the night.  Encouraged her to take it more consistently and is  to get used to taking the medication.  She worries about not being alert if she has to be in the middle of the night if something occurs.  Facilitated her identifying or self supportive thoughts, where she acknowledged that when she took the medication she was not "out of it".  She shared how her religious background played a role in her having some obsessive-compulsive tendencies, going on to share details.   ? ? ?Interventions: Motivational interviewing, supportive therapy ? ?Diagnosis: Obsessive-compulsive disorder, unspecified type ? ?long-term goal:   ?Utilize coping as discussed in session, utilize support system, continue to maintain functioning about working her part-time jobs to pay assigned bills. ? ?Short-term goal:  ?Verbalize an understanding of how thoughts, physical feelings, and behavioral actions contribute to anxiety and its treatment. ?Verbalize an understanding of the role that fearful thinking plays in creating fears, excessive worry, and anxiety / panic. ?Reduce compulsive behaviors by utilizing coping skills discussed. ?Patient to continue take medications as prescribed and report any concerns to prescribing provider. ? ?Assessment of progress:  progressing ?. ? ?Waldron Session, [facility] ?

## 2021-04-28 ENCOUNTER — Ambulatory Visit: Payer: BC Managed Care – PPO | Admitting: Mental Health

## 2021-05-01 ENCOUNTER — Ambulatory Visit: Payer: BC Managed Care – PPO | Admitting: Mental Health

## 2021-05-12 ENCOUNTER — Ambulatory Visit: Payer: BC Managed Care – PPO | Admitting: Mental Health

## 2021-05-12 DIAGNOSIS — F411 Generalized anxiety disorder: Secondary | ICD-10-CM | POA: Diagnosis not present

## 2021-05-12 NOTE — Progress Notes (Signed)
?     [facility] Counselor/Therapist Progress Note ? ? ?Patient ID: Michaela Pena, MRN: [id number] ? ?Date:  05/12/20 ? ?Timespent: 54 minutes   ? ?Treatment Type: Individual therapy ? ?Mental Status Exam: ?  ? ?Appearance:   casual  ?Behavior:  wnl  ?Motor:  wnl  ?Speech/Language:   Clear and Coherent  ?Affect:  Full range  ?Mood:  anxious, pleasant  ?Thought process:  Coherent and Relevant, goal directed  ?Thought content:    WDL, no SI/HI  ?Perceptual disturbances:    none  ?Orientation:  Full (Time, Place, and Person)  ?Attention:  Good  ?Concentration:  good  ?Memory:  WNL  ?Fund of knowledge:   Good  ?Insight:    Good  ?Judgment:   Good  ?Impulse Control:  developing  ? ?Reported Symptoms: Daily anxiety, obsessive thoughts, compulsive behavior, panic attacks ? ?Risk Assessment: ?Danger to Self:  No ?Self-injurious Behavior: No ?Danger to Others: No ?Duty to Warn:no ?Physical Aggression / Violence:No  ?Access to Firearms a concern: No  ?Gang Involvement:No   ?Patient / guardian was educated about steps to take if suicide or homicide risk level increases between visits. ?While future psychiatric events cannot be accurately predicted, the patient does not currently require acute inpatient psychiatric care and does not currently meet [facility] involuntary commitment criteria.  ? ?Subjective:  ?Patient presents for session on time.  Events in progress were shared.  Patient stated that she went to visit family in [place] during Pass[place].  She stated she was there for about 2 weeks and also during that time her grandmother had set up some dates for patient to continue to engage her in the [place] process.  Although she participated, patient continues to have no desire to get married or start a family.  She says she plans to go with family in a few weeks to [place].  She looks forward to the experience but also would rather it be less religiously focused.  Assessed family relationships, getting along well  with her mother; states that she has not engaged her about [place], which patient welcomes.  Ways to cope and care for herself were explored.  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  She is practicing good sleep hygiene and is unsure why her sleep has been interrupted for the past few months.  We discussed the potential benefits of exercise, continuing to walk her dog for at least 2 miles per day. ? ? ?Interventions: Motivational interviewing, supportive therapy ? ?Diagnosis: Obsessive-compulsive disorder, unspecified type ? ?long-term goal:   ?Utilize coping as discussed in session, utilize support system, continue to maintain functioning about working her part-time jobs to pay assigned bills. ? ?Short-term goal:  ?Verbalize an understanding of how thoughts, physical feelings, and behavioral actions contribute to anxiety and its treatment. ?Verbalize an understanding of the role that fearful thinking plays in creating fears, excessive worry, and anxiety / panic. ?Reduce compulsive behaviors by utilizing coping skills discussed. ?Patient to continue take medications as prescribed and report any concerns to prescribing provider. ? ?Assessment of progress:  progressing ?. ? ?Waldron Session, [facility] ?

## 2021-05-13 ENCOUNTER — Encounter: Payer: Self-pay | Admitting: Psychiatry

## 2021-05-13 ENCOUNTER — Ambulatory Visit: Payer: BC Managed Care – PPO | Admitting: Psychiatry

## 2021-05-13 VITALS — BP 111/69 | HR 90

## 2021-05-13 DIAGNOSIS — F900 Attention-deficit hyperactivity disorder, predominantly inattentive type: Secondary | ICD-10-CM

## 2021-05-13 DIAGNOSIS — G4700 Insomnia, unspecified: Secondary | ICD-10-CM

## 2021-05-13 DIAGNOSIS — F429 Obsessive-compulsive disorder, unspecified: Secondary | ICD-10-CM

## 2021-05-13 DIAGNOSIS — F411 Generalized anxiety disorder: Secondary | ICD-10-CM

## 2021-05-13 DIAGNOSIS — F3342 Major depressive disorder, recurrent, in full remission: Secondary | ICD-10-CM | POA: Diagnosis not present

## 2021-05-13 MED ORDER — FLUOXETINE HCL 40 MG PO CAPS: 40.0000 mg | ORAL_CAPSULE | Freq: Every day | ORAL | 1 refills | Status: DC

## 2021-05-13 MED ORDER — DOXEPIN HCL 10 MG PO CAPS: 10.0000 mg | ORAL_CAPSULE | Freq: Every evening | ORAL | 2 refills | Status: DC | PRN

## 2021-05-13 MED ORDER — DEXTROAMPHETAMINE SULFATE 15 MG PO TABS: 15.0000 mg | ORAL_TABLET | Freq: Every morning | ORAL | 0 refills | Status: DC

## 2021-05-13 NOTE — Progress Notes (Signed)
Michaela Pena ?[id number] ?02/20/1995 ?26 y.o. ? ?Subjective:  ? ?Patient ID:  Michaela Pena is a 26 y.o. (DOB 08/17/1995) female. ? ?Chief Complaint:  ?Chief Complaint  ?Patient presents with  ? Insomnia  ? Follow-up  ?  Anxiety, depression  ? ? ?HPI ?Michaela Pena presents to the office today for follow-up of anxiety and depression. Sleeping 5-6 hours a night. Denies difficulty falling asleep and then has had trouble returning to sleep. Denies worsening insomnia with increase in Prozac. She reports that her mood has been "stable, level." Denies depressed mood. Anxiety has been  "pretty good." She denies excessive worry. Obsessive thoughts have been less intense. She notices compulsions are less stressful. She reports that obsessions and compulsions have been "manageable." Denies social anxiety. Appetite has been good. Energy and motivation have been ok. She reports that Dextroamphetamine no longer seems to be as effective as it was initially and notices little to no improvement. Denies SI.  ? ?She took Xanax prn when she was in her friend's wedding.  ? ?She works from 8-12 and 1-6 pm. Works as a Social workernanny.  ? ?Past Psychiatric Medication Trials: ?Wellbutrin XL ?Sertraline ?Adderall XR- Effective, however unable to tolerate due to n/v ?Adderall immediate release- side effects ?Evekeo ?Dextroamphetamine ?Alprazolam-effective ?Trazodone- caused nightmares in the past. Effective. ?Hydroxyzine- ineffective ?  ? ?Review of Systems:  ?Review of Systems  ?Cardiovascular:  Negative for palpitations.  ?Musculoskeletal:  Negative for gait problem.  ?Neurological:  Negative for tremors and headaches.  ?Psychiatric/Behavioral:    ?     Please refer to HPI  ? ?Medications: I have reviewed the patient's current medications. ? ?Current Outpatient Medications  ?Medication Sig Dispense Refill  ? Dextroamphetamine Sulfate 15 MG TABS Take 15 mg by mouth every morning. 30 tablet 0  ? doxepin (SINEQUAN) 10 MG  capsule Take 1 capsule (10 mg total) by mouth at bedtime as needed. 30 capsule 2  ? ALPRAZolam (XANAX) 0.25 MG tablet Take 1/2-2 tabs po qd prn anxiety (Patient not taking: Reported on 02/05/2021) 30 tablet 1  ? FLUoxetine (PROZAC) 40 MG capsule Take 1 capsule (40 mg total) by mouth daily. 90 capsule 1  ? levothyroxine (SYNTHROID) 25 MCG tablet Take 25 mcg by mouth daily before breakfast. (Patient not taking: Reported on 02/05/2021)    ? ?No current facility-administered medications for this visit.  ? ? ?Medication Side Effects: None ? ?Allergies: No Known Allergies ? ?Past Medical History:  ?Diagnosis Date  ? ADD (attention deficit disorder)   ? Anxiety   ? Thyroid disease   ? ? ?Past Medical History, Surgical history, Social history, and Family history were reviewed and updated as appropriate.  ? ?Please see review of systems for further details on the patient's review from today.  ? ?Objective:  ? ?Physical Exam:  ?BP 111/69   Pulse 90  ? ?Physical Exam ?Constitutional:   ?   General: She is not in acute distress. ?Musculoskeletal:     ?   General: No deformity.  ?Neurological:  ?   Mental Status: She is alert and oriented to person, place, and time.  ?   Coordination: Coordination normal.  ?Psychiatric:     ?   Attention and Perception: Attention and perception normal. She does not perceive auditory or visual hallucinations.     ?   Mood and Affect: Mood normal. Mood is not anxious or depressed. Affect is not labile, blunt, angry or inappropriate.     ?  Speech: Speech normal.     ?   Behavior: Behavior normal.     ?   Thought Content: Thought content normal. Thought content is not paranoid or delusional. Thought content does not include homicidal or suicidal ideation. Thought content does not include homicidal or suicidal plan.     ?   Cognition and Memory: Cognition and memory normal.     ?   Judgment: Judgment normal.  ?   Comments: Insight intact  ? ? ?Lab Review:  ?   ?Component Value Date/Time  ? NA 141  05/12/2019 1120  ? K 4.2 05/12/2019 1120  ? CL 107 05/12/2019 1120  ? CO2 27 05/12/2019 1120  ? GLUCOSE 94 05/12/2019 1120  ? BUN 12 05/12/2019 1120  ? CREATININE 0.82 05/12/2019 1120  ? CALCIUM 9.1 05/12/2019 1120  ? PROT 6.9 05/12/2019 1120  ? ALBUMIN 4.1 05/12/2019 1120  ? AST 15 05/12/2019 1120  ? ALT 11 05/12/2019 1120  ? ALKPHOS 85 05/12/2019 1120  ? BILITOT 0.4 05/12/2019 1120  ? GFRNONAA >60 05/12/2019 1120  ? GFRAA >60 05/12/2019 1120  ? ? ?   ?Component Value Date/Time  ? WBC 5.8 05/12/2019 1120  ? RBC 4.48 05/12/2019 1120  ? HGB 13.3 05/12/2019 1120  ? HCT 40.8 05/12/2019 1120  ? PLT 232 05/12/2019 1120  ? MCV 91.1 05/12/2019 1120  ? MCH 29.7 05/12/2019 1120  ? MCHC 32.6 05/12/2019 1120  ? RDW 12.1 05/12/2019 1120  ? LYMPHSABS 1.8 05/12/2019 1120  ? MONOABS 0.4 05/12/2019 1120  ? EOSABS 0.1 05/12/2019 1120  ? BASOSABS 0.0 05/12/2019 1120  ? ? ?No results found for: POCLITH, LITHIUM  ? ?No results found for: PHENYTOIN, PHENOBARB, VALPROATE, CBMZ  ? ?.res ?Assessment: Plan:   ?Pt seen for 30 minutes and time spent discussing treatment options for insomnia since she reports that she continues to sleep 5-6 hours a night and saw no improvement with Hydroxyzine. Discussed potential benefits, risks, and side effects of low dose Doxepin for insomnia. Will start Doxepin 10 mg po QHS prn insomnia.  ?She reports that Dextroamphetamine 10 mg po qd no longer seems to be effective for ADHD. Will increase dose to 15 mg po qd for ADHD.  ?Continue Prozac 40 mg po qd for depression and anxiety.  ?Pt to follow-up in 6 months or sooner if clinically indicated.  ?Requested pt call in 3 months to provide update and request additional scripts.  ?Recommend continuing psychotherapy with Elio Forget, [facility].  ?Patient advised to contact office with any questions, adverse effects, or acute worsening in signs and symptoms. ? ?Michaela Pena was seen today for insomnia and follow-up. ? ?Diagnoses and all orders for this visit: ? ?Attention  deficit hyperactivity disorder (ADHD), predominantly inattentive type ?-     Dextroamphetamine Sulfate 15 MG TABS; Take 15 mg by mouth every morning. ? ?Recurrent major depressive disorder, in full remission (HCC) ?-     FLUoxetine (PROZAC) 40 MG capsule; Take 1 capsule (40 mg total) by mouth daily. ? ?Generalized anxiety disorder ?-     FLUoxetine (PROZAC) 40 MG capsule; Take 1 capsule (40 mg total) by mouth daily. ? ?Obsessive-compulsive disorder, unspecified type ?-     FLUoxetine (PROZAC) 40 MG capsule; Take 1 capsule (40 mg total) by mouth daily. ? ?Insomnia, unspecified type ?-     doxepin (SINEQUAN) 10 MG capsule; Take 1 capsule (10 mg total) by mouth at bedtime as needed. ? ?  ? ?Please see After Visit Summary  for patient specific instructions. ? ?Future Appointments  ?Date Time Provider Department Center  ?05/26/2021  1:00 PM Waldron Session, [facility] CP-CP None  ?06/16/2021  1:00 PM Waldron Session, [facility] CP-CP None  ?06/30/2021  2:00 PM Waldron Session, [facility] CP-CP None  ?07/14/2021  1:00 PM Waldron Session, [facility] CP-CP None  ?08/13/2021  3:30 PM [facility] [place] [facility]  ?11/13/2021  8:30 AM Corie Chiquito, [facility]NP CP-CP None  ? ? ?No orders of the defined types were placed in this encounter. ? ? ?------------------------------- ?

## 2021-05-21 ENCOUNTER — Telehealth: Payer: Self-pay

## 2021-05-21 DIAGNOSIS — F900 Attention-deficit hyperactivity disorder, predominantly inattentive type: Secondary | ICD-10-CM

## 2021-05-21 MED ORDER — DEXTROAMPHETAMINE SULFATE 10 MG PO TABS: 15.0000 mg | ORAL_TABLET | Freq: Every day | ORAL | 0 refills | Status: DC

## 2021-05-21 NOTE — Telephone Encounter (Signed)
Please let pt know about info we received from pharmacy and that script was changed to 10 mg 1.5 tabs due to cost difference.  ?

## 2021-05-21 NOTE — Telephone Encounter (Signed)
[facility] is asking for a new Rx. They said a 15 mg tablet is only available as Brand [id number] and it's $80.00.  ? ?Instructed to change to 10 mg tablet take 1.5 tab #45 which is $15.00 ?

## 2021-05-26 ENCOUNTER — Ambulatory Visit (INDEPENDENT_AMBULATORY_CARE_PROVIDER_SITE_OTHER): Payer: BC Managed Care – PPO | Admitting: Mental Health

## 2021-05-26 DIAGNOSIS — F429 Obsessive-compulsive disorder, unspecified: Secondary | ICD-10-CM | POA: Diagnosis not present

## 2021-05-26 NOTE — Progress Notes (Signed)
?     [facility] Counselor/Therapist Progress Note ? ? ?Patient ID: Michaela Pena, MRN: [id number] ? ?Date:  05/26/20 ? ?Timespent: 54 minutes   ? ?Treatment Type: Individual therapy ? ?Mental Status Exam: ?  ? ?Appearance:   casual  ?Behavior:  wnl  ?Motor:  wnl  ?Speech/Language:   Clear and Coherent  ?Affect:  Full range  ?Mood:  anxious, pleasant  ?Thought process:  Coherent and Relevant, goal directed  ?Thought content:    WDL, no SI/HI  ?Perceptual disturbances:    none  ?Orientation:  Full (Time, Place, and Person)  ?Attention:  Good  ?Concentration:  good  ?Memory:  WNL  ?Fund of knowledge:   Good  ?Insight:    Good  ?Judgment:   Good  ?Impulse Control:  developing  ? ?Reported Symptoms: Daily anxiety, obsessive thoughts, compulsive behavior, panic attacks ? ?Risk Assessment: ?Danger to Self:  No ?Self-injurious Behavior: No ?Danger to Others: No ?Duty to Warn:no ?Physical Aggression / Violence:No  ?Access to Firearms a concern: No  ?Gang Involvement:No   ?Patient / guardian was educated about steps to take if suicide or homicide risk level increases between visits. ?While future psychiatric events cannot be accurately predicted, the patient does not currently require acute inpatient psychiatric care and does not currently meet [facility] involuntary commitment criteria.  ? ?Subjective:  ?Patient presents for session on time.  Patient shared recent events, how she had a discussion with her mother due to her mother speaking her Warehouse manager.  She stated that they determined if she was more in alignment with her religious faith, that her issues, including her anxiety would discontinue.  Patient does not agree with this promise.  She stated that she plans to continue the sessions with the counselor and to some adherence to her mother's expectations in alignment with their religious faith, such as wearing certain clothing. ?Patient stated she also had an issue recently with her sister who is  home from college.  She stated that her sister had a friend helping her move back home and how this ultimately was upsetting to patient due to her sister allowing this friend to walk in their home, through patient's bedroom to a back bathroom that was available.  Patient stated she got further upset due to her sister lying about the friend having shoes on.  Through guided discovery, patient identified her reaction as an "overreaction".  Encouraged her to consider how she could have handled the situation differently given the fact that she acknowledges that her challenges with obsessive compulsiveness were at the center of the issue.  Assisted her in reframing some thoughts associated. ? ? ?Interventions: Motivational interviewing, supportive therapy ? ?Diagnosis: Obsessive-compulsive disorder, unspecified type ? ?long-term goal:   ?Utilize coping as discussed in session, utilize support system, continue to maintain functioning about working her part-time jobs to pay assigned bills. ? ?Short-term goal:  ?Verbalize an understanding of how thoughts, physical feelings, and behavioral actions contribute to anxiety and its treatment. ?Verbalize an understanding of the role that fearful thinking plays in creating fears, excessive worry, and anxiety / panic. ?Reduce compulsive behaviors by utilizing coping skills discussed. ?Patient to continue take medications as prescribed and report any concerns to prescribing provider. ? ?Assessment of progress:  progressing ?. ? ?Waldron Session, [facility] ?

## 2021-06-02 DIAGNOSIS — Z0000 Encounter for general adult medical examination without abnormal findings: Secondary | ICD-10-CM | POA: Diagnosis not present

## 2021-06-12 ENCOUNTER — Other Ambulatory Visit: Payer: Self-pay | Admitting: Psychiatry

## 2021-06-12 DIAGNOSIS — G4700 Insomnia, unspecified: Secondary | ICD-10-CM

## 2021-06-16 ENCOUNTER — Ambulatory Visit: Payer: BC Managed Care – PPO | Admitting: Mental Health

## 2021-06-16 DIAGNOSIS — F429 Obsessive-compulsive disorder, unspecified: Secondary | ICD-10-CM

## 2021-06-16 NOTE — Progress Notes (Signed)
[facility] Counselor/Therapist Progress Note   Patient ID: Michaela Pena, MRN: [id number]  Date:  06/16/20  Timespent: 53 minutes    Treatment Type: Individual therapy  Mental Status Exam:  Appearance:   casual  Behavior:  wnl  Motor:  wnl  Speech/Language:   Clear and Coherent  Affect:  Full range  Mood:  anxious, pleasant  Thought process:  Coherent and Relevant, goal directed  Thought content:    WDL, no SI/HI  Perceptual disturbances:    none  Orientation:  Full (Time, Place, and Person)  Attention:  Good  Concentration:  good  Memory:  WNL  Fund of knowledge:   Good  Insight:    Good  Judgment:   Good  Impulse Control:  developing   Reported Symptoms: Daily anxiety, obsessive thoughts, compulsive behavior, panic attacks  Risk Assessment: Danger to Self:  No Self-injurious Behavior: No Danger to Others: No Duty to Warn:no Physical Aggression / Violence:No  Access to Firearms a concern: No  Gang Involvement:No   Patient / guardian was educated about steps to take if suicide or homicide risk level increases between visits. While future psychiatric events cannot be accurately predicted, the patient does not currently require acute inpatient psychiatric care and does not currently meet [facility] involuntary commitment criteria.   Subjective:  Patient presents for session on time.  Patient shared recent events, how she her sisters and her mother recently returned from their 2-week trip to [place].  Patient shared some experiences from the trip.  She stated that she slept well there and says she has returned, she has slept more hours over the past few days.  She is hopeful that her sleep schedule will be improved as a result, hopefully getting about 7 to 8 hours per night; we encouraged good sleep hygiene.  Reviewed some content from past session related to her getting upset with her sister about having one of her friends walk in her house with shoes on.   Patient stated that she would rather not handle the situation, gets upset with her sister, recognizing that her reaction was not proportionate. She shared how she talks to one of her aunts about 2 times per week.  She stated these phone calls have been occurring since she has been age 26, to provide support and guidance in her faith following her parents separation at that time.  She stated the phone calls continue to be based upon this promise, however, her comments can often be shaming and degrading such as "guys will not like you if you wear glasses, you do not have the right face" or "you need to lose weight".  Patient denied these comments being upsetting as they have been occurring throughout many of their discussions; patient stated she finds them humorous at times.  Ways to potentially set boundaries in this relationship were explored collaboratively.     Interventions: Motivational interviewing, supportive therapy, CBT  Diagnosis: Obsessive-compulsive disorder, unspecified type  long-term goal:   Utilize coping as discussed in session, utilize support system, continue to maintain functioning about working her part-time jobs to pay assigned bills.  Short-term goal:  Verbalize an understanding of how thoughts, physical feelings, and behavioral actions contribute to anxiety and its treatment. Verbalize an understanding of the role that fearful thinking plays in creating fears, excessive worry, and anxiety / panic. Reduce compulsive behaviors by utilizing coping skills discussed. Patient to continue take medications as prescribed and report any concerns to prescribing provider.  Assessment of progress:  progressing   Waldron Session, [facility]

## 2021-06-30 ENCOUNTER — Ambulatory Visit: Payer: BC Managed Care – PPO | Admitting: Mental Health

## 2021-07-14 ENCOUNTER — Ambulatory Visit: Payer: BC Managed Care – PPO | Admitting: Mental Health

## 2021-07-21 ENCOUNTER — Other Ambulatory Visit: Payer: Self-pay

## 2021-07-21 ENCOUNTER — Telehealth: Payer: Self-pay | Admitting: Psychiatry

## 2021-07-21 ENCOUNTER — Ambulatory Visit: Payer: BC Managed Care – PPO | Admitting: Mental Health

## 2021-07-21 DIAGNOSIS — F900 Attention-deficit hyperactivity disorder, predominantly inattentive type: Secondary | ICD-10-CM | POA: Diagnosis not present

## 2021-07-21 DIAGNOSIS — F429 Obsessive-compulsive disorder, unspecified: Secondary | ICD-10-CM

## 2021-07-21 MED ORDER — DEXTROAMPHETAMINE SULFATE 10 MG PO TABS: 15.0000 mg | ORAL_TABLET | Freq: Every day | ORAL | 0 refills | Status: DC

## 2021-07-21 NOTE — Progress Notes (Signed)
     [facility] Counselor/Therapist Progress Note   Patient ID: Michaela Pena, MRN: [id number]  Date:  07/21/20  Timespent: 55 minutes    Treatment Type: Individual therapy  Mental Status Exam:  Appearance:   casual  Behavior:  wnl  Motor:  wnl  Speech/Language:   Clear and Coherent  Affect:  Full range  Mood:  anxious, pleasant  Thought process:  Coherent and Relevant, goal directed  Thought content:    WDL, no SI/HI  Perceptual disturbances:    none  Orientation:  Full (Time, Place, and Person)  Attention:  Good  Concentration:  good  Memory:  WNL  Fund of knowledge:   Good  Insight:    Good  Judgment:   Good  Impulse Control:  developing   Reported Symptoms: Daily anxiety, obsessive thoughts, compulsive behavior, panic attacks  Risk Assessment: Danger to Self:  No Self-injurious Behavior: No Danger to Others: No Duty to Warn:no Physical Aggression / Violence:No  Access to Firearms a concern: No  Gang Involvement:No   Patient / guardian was educated about steps to take if suicide or homicide risk level increases between visits. While future psychiatric events cannot be accurately predicted, the patient does not currently require acute inpatient psychiatric care and does not currently meet [facility] involuntary commitment criteria.   Subjective:  Patient presents for session on time.  Patient shared progress toward goals, relevant recent events.  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  Experiences were shared with some recent challenges with one of the families, however, patient stated that the arrangement is going well overall.  Family relationships were assessed.  She continues to receive calls from her aunt weekly.  There have been limits with the amount of meaning she has had with her [place] counselor which is pleasing to patient as she continues to not want to move forward with thought process.  Explored needs,  facilitating patient identifying ways she continues to cope with identifying family stress related to their religious background.  She shared how she continues to avoid confrontation, states she is hopeful at some point she will not have to receive his many phone calls from her aunt.  Explored some ways collaboratively toward setting some boundaries.   Interventions: Motivational interviewing, supportive therapy, CBT  Diagnosis: Obsessive-compulsive disorder, unspecified type  long-term goal:   Utilize coping as discussed in session, utilize support system, continue to maintain functioning about working her part-time jobs to pay assigned bills.  Short-term goal:  Verbalize an understanding of how thoughts, physical feelings, and behavioral actions contribute to anxiety and its treatment. Verbalize an understanding of the role that fearful thinking plays in creating fears, excessive worry, and anxiety / panic. Reduce compulsive behaviors by utilizing coping skills discussed. Patient to continue take medications as prescribed and report any concerns to prescribing provider.  Assessment of progress:  progressing   Waldron Session, [facility]

## 2021-07-21 NOTE — Telephone Encounter (Signed)
Pt was here for an appointment and asked for a refill on her dextrostat 10 mg. Pharmacy is the [facility] [address]

## 2021-07-21 NOTE — Telephone Encounter (Signed)
Pended.

## 2021-07-22 ENCOUNTER — Other Ambulatory Visit: Payer: Self-pay

## 2021-07-22 DIAGNOSIS — F900 Attention-deficit hyperactivity disorder, predominantly inattentive type: Secondary | ICD-10-CM

## 2021-07-22 MED ORDER — DEXTROAMPHETAMINE SULFATE 10 MG PO TABS: 15.0000 mg | ORAL_TABLET | Freq: Every day | ORAL | 0 refills | Status: DC

## 2021-07-28 ENCOUNTER — Ambulatory Visit: Payer: BC Managed Care – PPO | Admitting: Mental Health

## 2021-08-11 ENCOUNTER — Ambulatory Visit: Payer: BC Managed Care – PPO | Admitting: Mental Health

## 2021-08-11 ENCOUNTER — Other Ambulatory Visit: Payer: BC Managed Care – PPO

## 2021-08-11 DIAGNOSIS — F429 Obsessive-compulsive disorder, unspecified: Secondary | ICD-10-CM

## 2021-08-11 NOTE — Progress Notes (Signed)
     [facility] Counselor/Therapist Progress Note   Patient ID: Michaela Pena, MRN: [id number]  Date:  08/11/20  Timespent: 54 minutes    Treatment Type: Individual therapy  Mental Status Exam:  Appearance:   casual  Behavior:  wnl  Motor:  wnl  Speech/Language:   Clear and Coherent  Affect:  Full range  Mood:  anxious, pleasant  Thought process:  Coherent and Relevant, goal directed  Thought content:    WDL, no SI/HI  Perceptual disturbances:    none  Orientation:  Full (Time, Place, and Person)  Attention:  Good  Concentration:  good  Memory:  WNL  Fund of knowledge:   Good  Insight:    Good  Judgment:   Good  Impulse Control:  developing   Reported Symptoms: Daily anxiety, obsessive thoughts, compulsive behavior, panic attacks  Risk Assessment: Danger to Self:  No Self-injurious Behavior: No Danger to Others: No Duty to Warn:no Physical Aggression / Violence:No  Access to Firearms a concern: No  Gang Involvement:No   Patient / guardian was educated about steps to take if suicide or homicide risk level increases between visits. While future psychiatric events cannot be accurately predicted, the patient does not currently require acute inpatient psychiatric care and does not currently meet [facility] involuntary commitment criteria.   Subjective:  Patient presents for session on time.  Assessed progress.  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  Last week she worked upwards of 70 hours, exhausted most days however, this week is much less as 3 of the 4 families were on vacation.  She plans to continue this work indefinitely, continues to consider returning to college to complete her degree however, at this point she has no plans.  Assessed family relationships.  Her younger sister remains at home before returning to college in the fall, less stressful per patient as she has been less messy in the home as this can  frustrate patient.  Extended family has not pressured her about the [place] process, which pleases patient; also, she has not had to reengage with the counselor specific to that process over the last few months.  Although she denies feeling overtly depressed, she maintains "what is the point".  Continue to explore collaboratively in session where at this point, she is somewhat indifferent to needing change from being "negative" often in her thinking style.   Interventions: Motivational interviewing, supportive therapy, CBT  Diagnosis: Obsessive-compulsive disorder, unspecified type  long-term goal:   Utilize coping as discussed in session, utilize support system, continue to maintain functioning about working her part-time jobs to pay assigned bills.  Short-term goal:  Verbalize an understanding of how thoughts, physical feelings, and behavioral actions contribute to anxiety and its treatment. Verbalize an understanding of the role that fearful thinking plays in creating fears, excessive worry, and anxiety / panic. Reduce compulsive behaviors by utilizing coping skills discussed. Patient to continue take medications as prescribed and report any concerns to prescribing provider.  Assessment of progress:  progressing   Waldron Session, [facility]

## 2021-08-12 ENCOUNTER — Other Ambulatory Visit: Payer: BC Managed Care – PPO

## 2021-08-13 ENCOUNTER — Other Ambulatory Visit: Payer: BC Managed Care – PPO

## 2021-08-20 ENCOUNTER — Other Ambulatory Visit: Payer: Self-pay | Admitting: Obstetrics and Gynecology

## 2021-08-20 ENCOUNTER — Ambulatory Visit
Admission: RE | Admit: 2021-08-20 | Discharge: 2021-08-20 | Disposition: A | Discharge status: Home / Self Care | Payer: BC Managed Care – PPO | Source: Ambulatory Visit | Attending: Obstetrics and Gynecology | Admitting: Obstetrics and Gynecology

## 2021-08-20 DIAGNOSIS — N6322 Unspecified lump in the left breast, upper inner quadrant: Secondary | ICD-10-CM | POA: Diagnosis not present

## 2021-08-20 DIAGNOSIS — N6321 Unspecified lump in the left breast, upper outer quadrant: Secondary | ICD-10-CM | POA: Diagnosis not present

## 2021-08-20 DIAGNOSIS — N6320 Unspecified lump in the left breast, unspecified quadrant: Secondary | ICD-10-CM

## 2021-08-20 DIAGNOSIS — N63 Unspecified lump in unspecified breast: Secondary | ICD-10-CM

## 2021-09-01 ENCOUNTER — Ambulatory Visit: Payer: BC Managed Care – PPO | Admitting: Mental Health

## 2021-09-03 ENCOUNTER — Telehealth: Payer: Self-pay | Admitting: Psychiatry

## 2021-09-03 ENCOUNTER — Other Ambulatory Visit: Payer: Self-pay

## 2021-09-03 DIAGNOSIS — F900 Attention-deficit hyperactivity disorder, predominantly inattentive type: Secondary | ICD-10-CM

## 2021-09-03 NOTE — Telephone Encounter (Signed)
Pended.

## 2021-09-03 NOTE — Telephone Encounter (Signed)
Pt called at 3:48pm. Pt will need a RF on Dextrostat 10mg  sent in.  Send to: [id number] - [place], [place] - [address]

## 2021-09-04 MED ORDER — DEXTROAMPHETAMINE SULFATE 10 MG PO TABS: 15.0000 mg | ORAL_TABLET | Freq: Every day | ORAL | 0 refills | Status: DC

## 2021-09-16 ENCOUNTER — Ambulatory Visit: Payer: BC Managed Care – PPO | Admitting: Mental Health

## 2021-09-16 DIAGNOSIS — F429 Obsessive-compulsive disorder, unspecified: Secondary | ICD-10-CM | POA: Diagnosis not present

## 2021-09-16 NOTE — Progress Notes (Signed)
     [facility] Counselor/Therapist Progress Note   Patient ID: Michaela Pena, MRN: [id number]  Date:  09/16/20  Timespent: 53 minutes    Treatment Type: Individual therapy  Mental Status Exam:  Appearance:   casual  Behavior:  wnl  Motor:  wnl  Speech/Language:   Clear and Coherent  Affect:  Full range  Mood:  Euthymic, pleasant  Thought process:  Coherent and Relevant, goal directed  Thought content:    WDL, no SI/HI  Perceptual disturbances:    none  Orientation:  Full (Time, Place, and Person)  Attention:  Good  Concentration:  good  Memory:  WNL  Fund of knowledge:   Good  Insight:    Good  Judgment:   Good  Impulse Control:  developing   Reported Symptoms: Daily anxiety, obsessive thoughts, compulsive behavior, panic attacks  Risk Assessment: Danger to Self:  No Self-injurious Behavior: No Danger to Others: No Duty to Warn:no Physical Aggression / Violence:No  Access to Firearms a concern: No  Gang Involvement:No   Patient / guardian was educated about steps to take if suicide or homicide risk level increases between visits. While future psychiatric events cannot be accurately predicted, the patient does not currently require acute inpatient psychiatric care and does not currently meet [facility] involuntary commitment criteria.   Subjective:  Patient presents for session on time.  Patient shared recent events, progress.  She stated that her sister is now engaged and they went to her engagement party recently.  She stated that her sister has engaged in the [place] process which facilitated the engagement.  She went on to share feelings related, interaction with family during the event which added some stress.  She stated her grandmother continues to be critical of her while also trying to motivate her to engage in the process.  Patient continues to share how she does not want to engage in the process and she also continues to take phone calls weekly from  her on which had pressure and where her aunt could be often critical; patient stated that she is used to these phone calls that have occurred for the past few years, that is not highly stressful at this point although she would like her to discontinue some of the comments.  We explored collaboratively ways to communicate her feelings with her aunt.   Interventions: Motivational interviewing, supportive therapy, CBT  Diagnosis: Obsessive-compulsive disorder, unspecified type  long-term goal:   Utilize coping as discussed in session, utilize support system, continue to maintain functioning about working her part-time jobs to pay assigned bills.  Short-term goal:  Verbalize an understanding of how thoughts, physical feelings, and behavioral actions contribute to anxiety and its treatment. Verbalize an understanding of the role that fearful thinking plays in creating fears, excessive worry, and anxiety / panic. Reduce compulsive behaviors by utilizing coping skills discussed. Patient to continue take medications as prescribed and report any concerns to prescribing provider.  Assessment of progress:  progressing   Waldron Session, [facility]

## 2021-09-29 ENCOUNTER — Ambulatory Visit: Payer: BC Managed Care – PPO | Admitting: Mental Health

## 2021-09-29 DIAGNOSIS — F429 Obsessive-compulsive disorder, unspecified: Secondary | ICD-10-CM

## 2021-09-29 NOTE — Progress Notes (Signed)
     [facility] Counselor/Therapist Progress Note   Patient ID: Michaela Pena, MRN: [id number]  Date:  09/29/20  Timespent: 54 minutes    Treatment Type: Individual therapy  Mental Status Exam:  Appearance:   casual  Behavior:  wnl  Motor:  wnl  Speech/Language:   Clear and Coherent  Affect:  Full range  Mood:  Euthymic, pleasant  Thought process:  Coherent and Relevant, goal directed  Thought content:    WDL, no SI/HI  Perceptual disturbances:    none  Orientation:  Full (Time, Place, and Person)  Attention:  Good  Concentration:  good  Memory:  WNL  Fund of knowledge:   Good  Insight:    Good  Judgment:   Good  Impulse Control:  developing   Reported Symptoms: Daily anxiety, obsessive thoughts, compulsive behavior, panic attacks  Risk Assessment: Danger to Self:  No Self-injurious Behavior: No Danger to Others: No Duty to Warn:no Physical Aggression / Violence:No  Access to Firearms a concern: No  Gang Involvement:No   Patient / guardian was educated about steps to take if suicide or homicide risk level increases between visits. While future psychiatric events cannot be accurately predicted, the patient does not currently require acute inpatient psychiatric care and does not currently meet [facility] involuntary commitment criteria.   Subjective:  Patient presents for session on time.  Assessed progress, recent events.  She stated that she and her sisters recently visited family out of state for being religious only.  She stated during that time her grandmother approached her about continuing the [place] process.  Time spent with patient to share these experiences with her grandmother, feelings related.  Patient continues to not to take part in the process and this has been a stressful for both her and her grandmother is highly insisting.  This continues to be stressful for patient and has been ongoing for the past few years.  Explored collaboratively ways  to cope, blocking thoughts about the experience and the pressure that she is under whenever she talks to her grandmother or her aunt.  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  Patient stated this has been stressful that she has been present in the home during the verbal arguments that have been pervasive over the past several weeks.  Interventions: Motivational interviewing, supportive therapy, CBT  Diagnosis: Obsessive-compulsive disorder, unspecified type  long-term goal:   Utilize coping as discussed in session, utilize support system, continue to maintain functioning about working her part-time jobs to pay assigned bills.  Short-term goal:  Verbalize an understanding of how thoughts, physical feelings, and behavioral actions contribute to anxiety and its treatment. Verbalize an understanding of the role that fearful thinking plays in creating fears, excessive worry, and anxiety / panic. Reduce compulsive behaviors by utilizing coping skills discussed. Patient to continue take medications as prescribed and report any concerns to prescribing provider.  Assessment of progress:  progressing   Anson [place], [facility]

## 2021-10-13 ENCOUNTER — Ambulatory Visit: Payer: BC Managed Care – PPO | Admitting: Mental Health

## 2021-10-13 DIAGNOSIS — F429 Obsessive-compulsive disorder, unspecified: Secondary | ICD-10-CM

## 2021-10-13 NOTE — Progress Notes (Signed)
     [facility] Counselor/Therapist Progress Note   Patient ID: Michaela Pena, MRN: [id number]  Date: 10/13/20  Timespent: 52 minutes    Treatment Type: Individual therapy  Mental Status Exam:  Appearance:   casual  Behavior:  wnl  Motor:  wnl  Speech/Language:   Clear and Coherent  Affect:  Full range  Mood:  Euthymic, pleasant  Thought process:  Coherent and Relevant, goal directed  Thought content:    WDL, no SI/HI  Perceptual disturbances:    none  Orientation:  Full (Time, Place, and Person)  Attention:  Good  Concentration:  good  Memory:  WNL  Fund of knowledge:   Good  Insight:    Good  Judgment:   Good  Impulse Control:  developing   Reported Symptoms: Daily anxiety, obsessive thoughts, compulsive behavior, panic attacks  Risk Assessment: Danger to Self:  No Self-injurious Behavior: No Danger to Others: No Duty to Warn:no Physical Aggression / Violence:No  Access to Firearms a concern: No  Gang Involvement:No   Patient / guardian was educated about steps to take if suicide or homicide risk level increases between visits. While future psychiatric events cannot be accurately predicted, the patient does not currently require acute inpatient psychiatric care and does not currently meet [facility] involuntary commitment criteria.   Subjective:  Patient presents for session on time.  Assessed progress.  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  Patient went on to share how she has been looking for other employment and is considering contacting a friend who has linked her to families before for babysitting needs.  She primary stressor of late which was the health of her dog.  She stated that she has been several thousand dollars trying to help her dog recover from an undetermined illness.  She stated she had elevated anxiety, needing to take one of her medications last  week after worrying about her dog's health seeing some recent lab numbers which were ultimately incorrect after being able to speak to the lab a couple of days later.  Facilitated her identifying subsequent thoughts and experiences in that moment while providing support and understanding as she is very closely connected to her pets.  Her plan is to continue to seek employment, reach out to one of her contacts to look at other babysitting job options.  She denies having any current financial stress as she has been diligent in building her savings.   Interventions: Motivational interviewing, supportive therapy, CBT  Diagnosis: Obsessive-compulsive disorder, unspecified type  long-term goal:   Utilize coping as discussed in session, utilize support system, continue to maintain functioning about working her part-time jobs to pay assigned bills.  Short-term goal:  Verbalize an understanding of how thoughts, physical feelings, and behavioral actions contribute to anxiety and its treatment. Verbalize an understanding of the role that fearful thinking plays in creating fears, excessive worry, and anxiety / panic. Reduce compulsive behaviors by utilizing coping skills discussed. Patient to continue take medications as prescribed and report any concerns to prescribing provider.  Assessment of progress:  progressing   Anson [place], [facility]

## 2021-10-27 ENCOUNTER — Ambulatory Visit (INDEPENDENT_AMBULATORY_CARE_PROVIDER_SITE_OTHER): Payer: BC Managed Care – PPO | Admitting: Mental Health

## 2021-10-27 DIAGNOSIS — F32A Depression, unspecified: Secondary | ICD-10-CM

## 2021-10-27 NOTE — Progress Notes (Signed)
     [facility] Counselor/Therapist Progress Note   Patient ID: Michaela Pena, MRN: [id number]  Date: 10/27/20  Timespent: 54 minutes    Treatment Type: Individual therapy  Mental Status Exam:  Appearance:   casual  Behavior:  wnl  Motor:  wnl  Speech/Language:   Clear and Coherent  Affect:  Full range  Mood:  Euthymic, pleasant  Thought process:  Coherent and Relevant, goal directed  Thought content:    WDL, no SI/HI  Perceptual disturbances:    none  Orientation:  Full (Time, Place, and Person)  Attention:  Good  Concentration:  good  Memory:  WNL  Fund of knowledge:   Good  Insight:    Good  Judgment:   Good  Impulse Control:  developing   Reported Symptoms: Daily anxiety, obsessive thoughts, compulsive behavior, panic attacks  Risk Assessment: Danger to Self:  No Self-injurious Behavior: No Danger to Others: No Duty to Warn:no Physical Aggression / Violence:No  Access to Firearms a concern: No  Gang Involvement:No   Patient / guardian was educated about steps to take if suicide or homicide risk level increases between visits. While future psychiatric events cannot be accurately predicted, the patient does not currently require acute inpatient psychiatric care and does not currently meet [facility] involuntary commitment criteria.   Subjective:  Patient presents for session on time.  Assess progress, recent events.  Explored with the patient how she is coping and she has family and friends who reside out of the country who are currently coping with wartime aggression.  She stated that she knows that they are safe and all accounted for at this time.  Reports getting enough sleep however, is taking naps during the day more frequently, has some recent difficulty over the past few weeks engaging in pleasurable activities, reports some lower motivation but is functioning well in terms of work as she continues to work part to full-time hours.  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  Explored family relationships, getting along well with her mother currently, no distress about the [place] process as it has not been mentioned.  She stated her sister broke her engagement recently and plans to focus on school.   Explored collaboratively ways to care for self, outlets to engage in, such as walking exercise which she plans to begin.  Interventions: Motivational interviewing, supportive therapy, CBT  Diagnosis: Obsessive-compulsive disorder, unspecified type  long-term goal:   Utilize coping as discussed in session, utilize support system, continue to maintain functioning about working her part-time jobs to pay assigned bills.  Short-term goal:  Verbalize an understanding of how thoughts, physical feelings, and behavioral actions contribute to anxiety and its treatment. Verbalize an understanding of the role that fearful thinking plays in creating fears, excessive worry, and anxiety / panic. Reduce compulsive behaviors by utilizing coping skills discussed. Patient to continue take medications as prescribed and report any concerns to prescribing provider.  Assessment of progress:  progressing   Anson [place], [facility]

## 2021-11-10 ENCOUNTER — Ambulatory Visit: Payer: BC Managed Care – PPO | Admitting: Mental Health

## 2021-11-10 DIAGNOSIS — F429 Obsessive-compulsive disorder, unspecified: Secondary | ICD-10-CM

## 2021-11-10 NOTE — Progress Notes (Signed)
     [facility] Counselor/Therapist Progress Note   Patient ID: Michaela Pena, MRN: [id number]  Date: 11/10/20  Timespent: 54 minutes    Treatment Type: Individual therapy  Mental Status Exam:  Appearance:   casual  Behavior:  wnl  Motor:  wnl  Speech/Language:   Clear and Coherent  Affect:  Full range  Mood:  Euthymic, pleasant  Thought process:  Coherent and Relevant, goal directed  Thought content:    WDL, no SI/HI  Perceptual disturbances:    none  Orientation:  Full (Time, Place, and Person)  Attention:  Good  Concentration:  good  Memory:  WNL  Fund of knowledge:   Good  Insight:    Good  Judgment:   Good  Impulse Control:  developing   Reported Symptoms: Daily anxiety, obsessive thoughts, compulsive behavior, panic attacks  Risk Assessment: Danger to Self:  No Self-injurious Behavior: No Danger to Others: No Duty to Warn:no Physical Aggression / Violence:No  Access to Firearms a concern: No  Gang Involvement:No   Patient / guardian was educated about steps to take if suicide or homicide risk level increases between visits. While future psychiatric events cannot be accurately predicted, the patient does not currently require acute inpatient psychiatric care and does not currently meet [facility] involuntary commitment criteria.   Subjective:  Patient presents for session on time.  Assessed progress.  She shared how she continues to work part-time, close to full-time hours.  She stated her mother vacillates between being content with her employment choices to wanting her to go back to college and complete her degree.  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  At the point she plans to continue working in this capacity for the next 1 to 2 years.  She has been diligent in saving  money.  She continues to cope with levels of anxiety, states she has been working to improve management.  She stated she does not have to walk around her car as often as a checking behavior; we facilitated her identifying thoughts associated along with this behavior to continue to feel facilitate this change.   Interventions: Motivational interviewing, supportive therapy, CBT  Diagnosis: Obsessive-compulsive disorder, unspecified type  long-term goal:   Utilize coping as discussed in session, utilize support system, continue to maintain functioning about working her part-time jobs to pay assigned bills.  Short-term goal:  Verbalize an understanding of how thoughts, physical feelings, and behavioral actions contribute to anxiety and its treatment. Verbalize an understanding of the role that fearful thinking plays in creating fears, excessive worry, and anxiety / panic. Reduce compulsive behaviors by utilizing coping skills discussed. Patient to continue take medications as prescribed and report any concerns to prescribing provider.  Assessment of progress:  progressing   Anson [place], [facility]

## 2021-11-13 ENCOUNTER — Ambulatory Visit: Payer: BC Managed Care – PPO | Admitting: Psychiatry

## 2021-11-20 ENCOUNTER — Encounter: Payer: Self-pay | Admitting: Psychiatry

## 2021-11-20 ENCOUNTER — Ambulatory Visit: Payer: BC Managed Care – PPO | Admitting: Psychiatry

## 2021-11-20 DIAGNOSIS — F3342 Major depressive disorder, recurrent, in full remission: Secondary | ICD-10-CM

## 2021-11-20 DIAGNOSIS — F429 Obsessive-compulsive disorder, unspecified: Secondary | ICD-10-CM

## 2021-11-20 DIAGNOSIS — F411 Generalized anxiety disorder: Secondary | ICD-10-CM

## 2021-11-20 DIAGNOSIS — F900 Attention-deficit hyperactivity disorder, predominantly inattentive type: Secondary | ICD-10-CM

## 2021-11-20 MED ORDER — FLUOXETINE HCL 40 MG PO CAPS: 40.0000 mg | ORAL_CAPSULE | Freq: Every day | ORAL | 1 refills | Status: DC

## 2021-11-20 MED ORDER — DEXTROAMPHETAMINE SULFATE 10 MG PO TABS: 15.0000 mg | ORAL_TABLET | Freq: Every day | ORAL | 0 refills | Status: DC

## 2021-11-20 NOTE — Progress Notes (Signed)
Michaela Pena [id number] 1995-11-09 26 y.o.  Subjective:   Patient ID:  Michaela Pena is a 26 y.o. (DOB May 02, 1995) female.  Chief Complaint:  Chief Complaint  Patient presents with   Follow-up    Depression, anxiety, and ADHD    HPI Michaela Pena presents to the office today for follow-up of depression, anxiety, and ADHD. Michaela Pena reports that her anxiety has been "pretty good." Michaela Pena notices obsessive thoughts are "more manageable...less dire" and able to work through it. Michaela Pena reports some compulsive checking and "learned to work it into my schedule." Denies any recent panic.   Michaela Pena reports that Michaela Pena has "maybe a little bit" of depression and this may be related to her work slowing down. Michaela Pena reports that depression is improving some. Energy and motivation have been "alright." Has not not needed to concentrate as much recently. Michaela Pena notices that Dextroamphetamine is helpful and has been doing less the last few days after running out of Dextroamphetamine. Appetite and weight have been stable. Denies SI.   Michaela Pena reports that Michaela Pena traveled abroad in May and June. Michaela Pena reports that her sleep schedule improved after her travel.   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 Family that Michaela Pena was working for has moved. Michaela Pena is now working with a family more that Michaela Pena has been working with.   Denies any acute stressors.  Dextroamphetamine last filled 10/07/21.  Past Psychiatric Medication Trials: Wellbutrin XL Sertraline Prozac- Effective Adderall XR- Effective, however unable to tolerate due to n/v Adderall immediate release- side effects Evekeo Dextroamphetamine Alprazolam-effective Trazodone- caused nightmares in the past. Effective. Hydroxyzine- ineffective  Doxepin  Review of Systems:  Review of Systems  Cardiovascular:  Negative for palpitations.  Musculoskeletal:  Negative for gait problem.  Neurological:  Negative for tremors.  Psychiatric/Behavioral:         Please refer to  HPI    Medications: I have reviewed the patient's current medications.  Current Outpatient Medications  Medication Sig Dispense Refill   ALPRAZolam (XANAX) 0.25 MG tablet Take 1/2-2 tabs po qd prn anxiety (Patient not taking: Reported on 02/05/2021) 30 tablet 1   dextroamphetamine (DEXTROSTAT) 10 MG tablet Take 1.5 tablets (15 mg total) by mouth daily. 45 tablet 0   [START ON 12/18/2021] dextroamphetamine (DEXTROSTAT) 10 MG tablet Take 1.5 tablets (15 mg total) by mouth daily. 45 tablet 0   [START ON 01/15/2022] dextroamphetamine (DEXTROSTAT) 10 MG tablet Take 1.5 tablets (15 mg total) by mouth daily. 45 tablet 0   FLUoxetine (PROZAC) 40 MG capsule Take 1 capsule (40 mg total) by mouth daily. 90 capsule 1   levothyroxine (SYNTHROID) 25 MCG tablet Take 25 mcg by mouth daily before breakfast. (Patient not taking: Reported on 02/05/2021)     No current facility-administered medications for this visit.    Medication Side Effects: None  Allergies: No Known Allergies  Past Medical History:  Diagnosis Date   ADD (attention deficit disorder)    Anxiety    Thyroid disease     Past Medical History, Surgical history, Social history, and Family history were reviewed and updated as appropriate.   Please see review of systems for further details on the patient's review from today.   Objective:   Physical Exam:  BP 107/65   Pulse 94   Physical Exam Constitutional:      General: Michaela Pena is not in acute distress. Musculoskeletal:        General: No deformity.  Neurological:     Mental Status: Michaela Pena is alert  and oriented to person, place, and time.     Coordination: Coordination normal.  Psychiatric:        Attention and Perception: Attention and perception normal. Michaela Pena does not perceive auditory or visual hallucinations.        Mood and Affect: Mood normal. Mood is not anxious or depressed. Affect is not labile, blunt, angry or inappropriate.        Speech: Speech normal.        Behavior:  Behavior normal.        Thought Content: Thought content normal. Thought content is not paranoid or delusional. Thought content does not include homicidal or suicidal ideation. Thought content does not include homicidal or suicidal plan.        Cognition and Memory: Cognition and memory normal.        Judgment: Judgment normal.     Comments: Insight intact     Lab Review:     Component Value Date/Time   NA 141 05/12/2019 1120   K 4.2 05/12/2019 1120   CL 107 05/12/2019 1120   CO2 27 05/12/2019 1120   GLUCOSE 94 05/12/2019 1120   BUN 12 05/12/2019 1120   CREATININE 0.82 05/12/2019 1120   CALCIUM 9.1 05/12/2019 1120   PROT 6.9 05/12/2019 1120   ALBUMIN 4.1 05/12/2019 1120   AST 15 05/12/2019 1120   ALT 11 05/12/2019 1120   ALKPHOS 85 05/12/2019 1120   BILITOT 0.4 05/12/2019 1120   GFRNONAA >60 05/12/2019 1120   GFRAA >60 05/12/2019 1120       Component Value Date/Time   WBC 5.8 05/12/2019 1120   RBC 4.48 05/12/2019 1120   HGB 13.3 05/12/2019 1120   HCT 40.8 05/12/2019 1120   PLT 232 05/12/2019 1120   MCV 91.1 05/12/2019 1120   MCH 29.7 05/12/2019 1120   MCHC 32.6 05/12/2019 1120   RDW 12.1 05/12/2019 1120   LYMPHSABS 1.8 05/12/2019 1120   MONOABS 0.4 05/12/2019 1120   EOSABS 0.1 05/12/2019 1120   BASOSABS 0.0 05/12/2019 1120    No results found for: "POCLITH", "LITHIUM"   No results found for: "PHENYTOIN", "PHENOBARB", "VALPROATE", "CBMZ"   .res Assessment: Plan:   Will continue current plan of care since target signs and symptoms are well controlled without any tolerability issues. Continue Prozac 40 mg po qd for anxiety and depression.  Continue Dextroamphetamine 15 mg daily for ADHD.  Pt to follow-up in 6 months or sooner if clinically indicated.  Requested pt call in 3 months to provide update and request additional scripts.  Patient advised to contact office with any questions, adverse effects, or acute worsening in signs and symptoms.  Michaela Pena was seen  today for follow-up.  Diagnoses and all orders for this visit:  Attention deficit hyperactivity disorder (ADHD), predominantly inattentive type -     dextroamphetamine (DEXTROSTAT) 10 MG tablet; Take 1.5 tablets (15 mg total) by mouth daily. -     dextroamphetamine (DEXTROSTAT) 10 MG tablet; Take 1.5 tablets (15 mg total) by mouth daily. -     dextroamphetamine (DEXTROSTAT) 10 MG tablet; Take 1.5 tablets (15 mg total) by mouth daily.  Generalized anxiety disorder -     FLUoxetine (PROZAC) 40 MG capsule; Take 1 capsule (40 mg total) by mouth daily.  Recurrent major depressive disorder, in full remission (HCC) -     FLUoxetine (PROZAC) 40 MG capsule; Take 1 capsule (40 mg total) by mouth daily.  Obsessive-compulsive disorder, unspecified type -     FLUoxetine (PROZAC)  40 MG capsule; Take 1 capsule (40 mg total) by mouth daily.     Please see After Visit Summary for patient specific instructions.  Future Appointments  Date Time Provider Department Center  11/24/2021  1:00 PM Waldron Session, [facility] CP-CP None  12/15/2021  1:00 PM Waldron Session, [facility] CP-CP None  12/29/2021  1:00 PM Waldron Session, [facility] CP-CP None  02/23/2022  7:30 AM [facility] [place] 1 [facility]  05/21/2022  8:30 AM Corie Chiquito, PMHNP CP-CP None    No orders of the defined types were placed in this encounter.   -------------------------------

## 2021-11-24 ENCOUNTER — Ambulatory Visit: Payer: BC Managed Care – PPO | Admitting: Mental Health

## 2021-11-24 DIAGNOSIS — F900 Attention-deficit hyperactivity disorder, predominantly inattentive type: Secondary | ICD-10-CM

## 2021-11-24 NOTE — Progress Notes (Signed)
     [facility] Counselor/Therapist Progress Note   Patient ID: Charika Mikelson, MRN: [id number]  Date: 11/24/20  Timespent: 55 minutes    Treatment Type: Individual therapy  Mental Status Exam:  Appearance:   casual  Behavior:  wnl  Motor:  wnl  Speech/Language:   Clear and Coherent  Affect:  Full range  Mood:  Euthymic, pleasant  Thought process:  Coherent and Relevant, goal directed  Thought content:    WDL, no SI/HI  Perceptual disturbances:    none  Orientation:  Full (Time, Place, and Person)  Attention:  Good  Concentration:  good  Memory:  WNL  Fund of knowledge:   Good  Insight:    Good  Judgment:   Good  Impulse Control:  developing   Reported Symptoms: Daily anxiety, obsessive thoughts, compulsive behavior, panic attacks  Risk Assessment: Danger to Self:  No Self-injurious Behavior: No Danger to Others: No Duty to Warn:no Physical Aggression / Violence:No  Access to Firearms a concern: No  Gang Involvement:No   Patient / guardian was educated about steps to take if suicide or homicide risk level increases between visits. While future psychiatric events cannot be accurately predicted, the patient does not currently require acute inpatient psychiatric care and does not currently meet [facility] involuntary commitment criteria.   Subjective:  Patient presents for session on time.  Patient shared recent events, continues to work full-time in childcare.  She continues to consider going back to college to finish her degree however, she did not meet with her guidance counselor as discussed last visit.  She went on to share how she is unsure if she wants to return to school, but she does not look forward to taking her last class to complete her major.  Encouraged her just give herself time to further consider this decision.  She has vacillated over the past year. Explored family relationships where she stated she has not spoken to her father in the last few  months, his not calling her on her birthday which is typical and also not attending her sister's engagement party a few months ago.  She denied this being distressful, as it is typical for her father to have minimal to no contact with her or her sisters.  Explored ways she has tried to cope and care for herself recently, if she is not doing multiple activities where she had difficulty identifying; work history consider engaging in pleasurable activities and explored with patient during session to identify.   Interventions: Motivational interviewing, supportive therapy, CBT  Diagnosis: Obsessive-compulsive disorder, unspecified type  long-term goal:   Utilize coping as discussed in session, utilize support system, continue to maintain functioning about working her part-time jobs to pay assigned bills.  Short-term goal:  Verbalize an understanding of how thoughts, physical feelings, and behavioral actions contribute to anxiety and its treatment. Verbalize an understanding of the role that fearful thinking plays in creating fears, excessive worry, and anxiety / panic. Reduce compulsive behaviors by utilizing coping skills discussed. Patient to continue take medications as prescribed and report any concerns to prescribing provider.  Assessment of progress:  progressing   Waldron Session, [facility]

## 2021-12-15 ENCOUNTER — Ambulatory Visit: Payer: BC Managed Care – PPO | Admitting: Mental Health

## 2021-12-15 DIAGNOSIS — F411 Generalized anxiety disorder: Secondary | ICD-10-CM | POA: Diagnosis not present

## 2021-12-15 NOTE — Progress Notes (Signed)
     [facility] Counselor/Therapist Progress Note   Patient ID: Michaela Pena, MRN: [id number]  Date: 12/15/20  Timespent: 53 minutes    Treatment Type: Individual therapy  Mental Status Exam:  Appearance:   casual  Behavior:  wnl  Motor:  wnl  Speech/Language:   Clear and Coherent  Affect:  Full range  Mood:  Euthymic, pleasant  Thought process:  Coherent and Relevant, goal directed  Thought content:    WDL, no SI/HI  Perceptual disturbances:    none  Orientation:  Full (Time, Place, and Person)  Attention:  Good  Concentration:  good  Memory:  WNL  Fund of knowledge:   Good  Insight:    Good  Judgment:   Good  Impulse Control:  developing   Reported Symptoms: Daily anxiety, obsessive thoughts, compulsive behavior, panic attacks  Risk Assessment: Danger to Self:  No Self-injurious Behavior: No Danger to Others: No Duty to Warn:no Physical Aggression / Violence:No  Access to Firearms a concern: No  Gang Involvement:No   Patient / guardian was educated about steps to take if suicide or homicide risk level increases between visits. While future psychiatric events cannot be accurately predicted, the patient does not currently require acute inpatient psychiatric care and does not currently meet [facility] involuntary commitment criteria.   Subjective:  Patient presents for session on time.  Assessed recent events where patient stated that her dog passed away last week.  She stated that due to his ongoing medical issues, they had to make the difficult decision and putting the dog down.  This was patient's primary pet for the last 9 years.  Facilitated her identifying associated feelings related to this loss, explored collaboratively ways to remember her dog where she plans to continue to gather memorable photos.  She stated her sisters were able to join via video to say their goodbyes.  Due to this loss, patient stated her sleep has been much more broken,  therefore, she took one of her prescribed medications to assist with sleep last night and was able to sleep for around 12 hours.  She plans to try and get her sleep schedule back on track and go back to work tomorrow as she took off several days to allow herself to grieve.  Support was provided throughout.  Interventions: Motivational interviewing, supportive therapy, CBT  Diagnosis: Obsessive-compulsive disorder, unspecified type  long-term goal:   Utilize coping as discussed in session, utilize support system, continue to maintain functioning about working her part-time jobs to pay assigned bills.  Short-term goal:  Verbalize an understanding of how thoughts, physical feelings, and behavioral actions contribute to anxiety and its treatment. Verbalize an understanding of the role that fearful thinking plays in creating fears, excessive worry, and anxiety / panic. Reduce compulsive behaviors by utilizing coping skills discussed. Patient to continue take medications as prescribed and report any concerns to prescribing provider.  Assessment of progress:  progressing   Waldron Session, [facility]

## 2021-12-25 DIAGNOSIS — Z124 Encounter for screening for malignant neoplasm of cervix: Secondary | ICD-10-CM | POA: Diagnosis not present

## 2021-12-25 DIAGNOSIS — Z01419 Encounter for gynecological examination (general) (routine) without abnormal findings: Secondary | ICD-10-CM | POA: Diagnosis not present

## 2021-12-29 ENCOUNTER — Ambulatory Visit: Payer: BC Managed Care – PPO | Admitting: Mental Health

## 2021-12-29 DIAGNOSIS — F411 Generalized anxiety disorder: Secondary | ICD-10-CM | POA: Diagnosis not present

## 2021-12-29 DIAGNOSIS — F330 Major depressive disorder, recurrent, mild: Secondary | ICD-10-CM

## 2021-12-29 NOTE — Progress Notes (Signed)
     [facility] Counselor/Therapist Progress Note   Patient ID: Michaela Pena, MRN: [id number]  Date: 12/29/20  Timespent: 54 minutes    Treatment Type: Individual therapy  Mental Status Exam:  Appearance:   casual  Behavior:  wnl  Motor:  wnl  Speech/Language:   Clear and Coherent  Affect:  Full range  Mood:  Euthymic, pleasant  Thought process:  Coherent and Relevant, goal directed  Thought content:    WDL, no SI/HI  Perceptual disturbances:    none  Orientation:  Full (Time, Place, and Person)  Attention:  Good  Concentration:  good  Memory:  WNL  Fund of knowledge:   Good  Insight:    Good  Judgment:   Good  Impulse Control:  developing   Reported Symptoms: Daily anxiety, obsessive thoughts, compulsive behavior, panic attacks  Risk Assessment: Danger to Self:  No Self-injurious Behavior: No Danger to Others: No Duty to Warn:no Physical Aggression / Violence:No  Access to Firearms a concern: No  Gang Involvement:No   Patient / guardian was educated about steps to take if suicide or homicide risk level increases between visits. While future psychiatric events cannot be accurately predicted, the patient does not currently require acute inpatient psychiatric care and does not currently meet [facility] involuntary commitment criteria.   Subjective:  Patient presents for session on time.  Assessed progress.  Patient shared how her sisters came out from college and overall has been pleasant having them back.  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  Explored her recent mood where she stated she has felt depressed in part due to losing her dog however, she stated she has felt somewhat depressed over the past few months.  She had difficulty identifying specific reasons initially.  She eventually identified wanting to have more social outlets, friendships.  She stated she does not have much in common with her friends locally, avoid spending time  with them due to these differences.  We explored other interests with which she could engage.  She stated that she sometimes will come home from work and just lie in bed, may do this on her days off and how her younger sister has been helpful, encouraging her to spend time with her.  Encouraged her to explore other ways to make friends and engage socially.  Interventions: Motivational interviewing, supportive therapy, CBT  Diagnosis: Obsessive-compulsive disorder, unspecified type  long-term goal:   Utilize coping as discussed in session, utilize support system, continue to maintain functioning about working her part-time jobs to pay assigned bills.  Short-term goal:  Verbalize an understanding of how thoughts, physical feelings, and behavioral actions contribute to anxiety and its treatment. Verbalize an understanding of the role that fearful thinking plays in creating fears, excessive worry, and anxiety / panic. Reduce compulsive behaviors by utilizing coping skills discussed. Patient to continue take medications as prescribed and report any concerns to prescribing provider.  Assessment of progress:  progressing   Waldron Session, [facility]

## 2022-01-08 ENCOUNTER — Encounter: Payer: Self-pay | Admitting: Genetic Counselor

## 2022-01-13 ENCOUNTER — Telehealth: Payer: Self-pay | Admitting: Hematology and Oncology

## 2022-01-13 NOTE — Telephone Encounter (Signed)
Scheduled appointment per scheduling message. Left voicemail.  

## 2022-01-21 ENCOUNTER — Ambulatory Visit: Payer: BC Managed Care – PPO | Admitting: Mental Health

## 2022-02-09 ENCOUNTER — Ambulatory Visit: Payer: BC Managed Care – PPO | Admitting: Mental Health

## 2022-02-19 DIAGNOSIS — F4320 Adjustment disorder, unspecified: Secondary | ICD-10-CM | POA: Diagnosis not present

## 2022-02-23 ENCOUNTER — Ambulatory Visit
Admission: RE | Admit: 2022-02-23 | Discharge: 2022-02-23 | Disposition: A | Discharge status: Home / Self Care | Payer: BC Managed Care – PPO | Source: Ambulatory Visit | Attending: Obstetrics and Gynecology | Admitting: Obstetrics and Gynecology

## 2022-02-23 ENCOUNTER — Ambulatory Visit: Payer: BC Managed Care – PPO | Admitting: Mental Health

## 2022-02-23 DIAGNOSIS — N6321 Unspecified lump in the left breast, upper outer quadrant: Secondary | ICD-10-CM | POA: Diagnosis not present

## 2022-02-23 DIAGNOSIS — N6322 Unspecified lump in the left breast, upper inner quadrant: Secondary | ICD-10-CM | POA: Diagnosis not present

## 2022-02-23 DIAGNOSIS — N6320 Unspecified lump in the left breast, unspecified quadrant: Secondary | ICD-10-CM

## 2022-02-24 ENCOUNTER — Telehealth: Payer: Self-pay | Admitting: Genetic Counselor

## 2022-02-24 NOTE — Telephone Encounter (Signed)
Per 2/13 IB reached out to patient to reschedule appointment, left voicemail for patient to call back to reschedule.

## 2022-02-26 DIAGNOSIS — F4320 Adjustment disorder, unspecified: Secondary | ICD-10-CM | POA: Diagnosis not present

## 2022-03-03 ENCOUNTER — Inpatient Hospital Stay: Payer: BC Managed Care – PPO

## 2022-03-03 ENCOUNTER — Inpatient Hospital Stay: Payer: BC Managed Care – PPO | Admitting: Genetic Counselor

## 2022-03-05 DIAGNOSIS — F4320 Adjustment disorder, unspecified: Secondary | ICD-10-CM | POA: Diagnosis not present

## 2022-03-12 DIAGNOSIS — F4320 Adjustment disorder, unspecified: Secondary | ICD-10-CM | POA: Diagnosis not present

## 2022-03-19 DIAGNOSIS — F4320 Adjustment disorder, unspecified: Secondary | ICD-10-CM | POA: Diagnosis not present

## 2022-03-25 ENCOUNTER — Telehealth: Payer: Self-pay | Admitting: Psychiatry

## 2022-03-25 DIAGNOSIS — F900 Attention-deficit hyperactivity disorder, predominantly inattentive type: Secondary | ICD-10-CM

## 2022-03-25 MED ORDER — DEXTROAMPHETAMINE SULFATE 10 MG PO TABS: 15.0000 mg | ORAL_TABLET | Freq: Every day | ORAL | 0 refills | Status: DC

## 2022-03-25 NOTE — Telephone Encounter (Signed)
Pt notified LVM

## 2022-03-25 NOTE — Telephone Encounter (Signed)
Pt LVM requesting dextroamphetamine 10 mg tablets to [facility] [place]. In stock. Apt 5/9

## 2022-03-25 NOTE — Telephone Encounter (Signed)
Please let her know scripts have been sent.

## 2022-03-26 DIAGNOSIS — F4320 Adjustment disorder, unspecified: Secondary | ICD-10-CM | POA: Diagnosis not present

## 2022-05-15 ENCOUNTER — Other Ambulatory Visit: Payer: Self-pay | Admitting: Psychiatry

## 2022-05-15 DIAGNOSIS — F429 Obsessive-compulsive disorder, unspecified: Secondary | ICD-10-CM

## 2022-05-15 DIAGNOSIS — F3342 Major depressive disorder, recurrent, in full remission: Secondary | ICD-10-CM

## 2022-05-15 DIAGNOSIS — F411 Generalized anxiety disorder: Secondary | ICD-10-CM

## 2022-05-21 ENCOUNTER — Ambulatory Visit: Payer: BC Managed Care – PPO | Admitting: Psychiatry

## 2022-07-22 ENCOUNTER — Other Ambulatory Visit: Payer: Self-pay

## 2022-07-22 ENCOUNTER — Telehealth: Payer: Self-pay | Admitting: Psychiatry

## 2022-07-22 DIAGNOSIS — F900 Attention-deficit hyperactivity disorder, predominantly inattentive type: Secondary | ICD-10-CM

## 2022-07-22 DIAGNOSIS — E039 Hypothyroidism, unspecified: Secondary | ICD-10-CM | POA: Diagnosis not present

## 2022-07-22 MED ORDER — DEXTROAMPHETAMINE SULFATE 10 MG PO TABS: 15.0000 mg | ORAL_TABLET | Freq: Every day | ORAL | 0 refills | Status: DC

## 2022-07-22 NOTE — Telephone Encounter (Signed)
Pended.

## 2022-07-22 NOTE — Telephone Encounter (Signed)
Pt requesting Rx dextroamphetamine 10 mg 1.5 tablets = 15 mg daily. [facility] [address]  [address]

## 2022-08-03 ENCOUNTER — Encounter: Payer: Self-pay | Admitting: Psychiatry

## 2022-08-03 ENCOUNTER — Ambulatory Visit (INDEPENDENT_AMBULATORY_CARE_PROVIDER_SITE_OTHER): Payer: BC Managed Care – PPO | Admitting: Psychiatry

## 2022-08-03 VITALS — BP 102/64 | HR 92

## 2022-08-03 DIAGNOSIS — F3342 Major depressive disorder, recurrent, in full remission: Secondary | ICD-10-CM | POA: Diagnosis not present

## 2022-08-03 DIAGNOSIS — F900 Attention-deficit hyperactivity disorder, predominantly inattentive type: Secondary | ICD-10-CM

## 2022-08-03 DIAGNOSIS — F411 Generalized anxiety disorder: Secondary | ICD-10-CM

## 2022-08-03 MED ORDER — DEXTROAMPHETAMINE SULFATE 10 MG PO TABS: 15.0000 mg | ORAL_TABLET | Freq: Every day | ORAL | 0 refills | Status: DC

## 2022-08-03 NOTE — Progress Notes (Unsigned)
Michaela Pena [id number] 1995/07/20 26 y.o.  Subjective:   Patient ID:  Michaela Pena is a 27 y.o. (DOB May 21, 1995) female.  Chief Complaint:  Chief Complaint  Patient presents with   Follow-up    ADHD, anxiety, and depression    HPI Michaela Pena presents to the office today for follow-up of ADHD, anxiety, and depression. She reports, "I've been feeling pretty good. She reports that anxiety has been manageable. She reports that she has "spurts" of depression when she does not Dextroamphetamine and her energy and motivation are low and she is unable to concentrate. She reports adequate concentration with medication. Energy and motivation are adequate with medication. Sleeping about 7-8 hours a night. Appetite has been good. Denies SI.   Dextroamphetamine last filled 07/21/12.   Past Psychiatric Medication Trials: Wellbutrin XL Sertraline Prozac- Effective Adderall XR- Effective, however unable to tolerate due to n/v Adderall immediate release- side effects Evekeo Dextroamphetamine Alprazolam-effective Trazodone- caused nightmares in the past. Effective. Hydroxyzine- ineffective  Doxepin  Review of Systems:  Review of Systems  Cardiovascular:  Negative for palpitations.  Musculoskeletal:  Negative for gait problem.  Neurological:  Negative for tremors.  Psychiatric/Behavioral:         Please refer to HPI    Medications: I have reviewed the patient's current medications.  Current Outpatient Medications  Medication Sig Dispense Refill   FLUoxetine (PROZAC) 40 MG capsule TAKE 1 CAPSULE (40 MG TOTAL) BY MOUTH DAILY. 90 capsule 10   ALPRAZolam (XANAX) 0.25 MG tablet Take 1/2-2 tabs po qd prn anxiety (Patient not taking: Reported on 02/05/2021) 30 tablet 1   [START ON 08/19/2022] dextroamphetamine (DEXTROSTAT) 10 MG tablet Take 1.5 tablets (15 mg total) by mouth daily. 45 tablet 0   [START ON 09/16/2022] dextroamphetamine (DEXTROSTAT) 10 MG tablet Take  1.5 tablets (15 mg total) by mouth daily. 45 tablet 0   [START ON 10/14/2022] dextroamphetamine (DEXTROSTAT) 10 MG tablet Take 1.5 tablets (15 mg total) by mouth daily. 45 tablet 0   levothyroxine (SYNTHROID) 25 MCG tablet Take 25 mcg by mouth daily before breakfast. (Patient not taking: Reported on 02/05/2021)     No current facility-administered medications for this visit.    Medication Side Effects: None  Allergies: No Known Allergies  Past Medical History:  Diagnosis Date   ADD (attention deficit disorder)    Anxiety    Thyroid disease     Past Medical History, Surgical history, Social history, and Family history were reviewed and updated as appropriate.   Please see review of systems for further details on the patient's review from today.   Objective:   Physical Exam:  BP 102/64   Pulse 92   Physical Exam Constitutional:      General: She is not in acute distress. Musculoskeletal:        General: No deformity.  Neurological:     Mental Status: She is alert and oriented to person, place, and time.     Coordination: Coordination normal.  Psychiatric:        Attention and Perception: Attention and perception normal. She does not perceive auditory or visual hallucinations.        Mood and Affect: Mood normal. Mood is not anxious or depressed. Affect is not labile, blunt, angry or inappropriate.        Speech: Speech normal.        Behavior: Behavior normal.        Thought Content: Thought content normal. Thought content is not paranoid  or delusional. Thought content does not include homicidal or suicidal ideation. Thought content does not include homicidal or suicidal plan.        Cognition and Memory: Cognition and memory normal.        Judgment: Judgment normal.     Comments: Insight intact     Lab Review:     Component Value Date/Time   NA 141 05/12/2019 1120   K 4.2 05/12/2019 1120   CL 107 05/12/2019 1120   CO2 27 05/12/2019 1120   GLUCOSE 94 05/12/2019 1120    BUN 12 05/12/2019 1120   CREATININE 0.82 05/12/2019 1120   CALCIUM 9.1 05/12/2019 1120   PROT 6.9 05/12/2019 1120   ALBUMIN 4.1 05/12/2019 1120   AST 15 05/12/2019 1120   ALT 11 05/12/2019 1120   ALKPHOS 85 05/12/2019 1120   BILITOT 0.4 05/12/2019 1120   GFRNONAA >60 05/12/2019 1120   GFRAA >60 05/12/2019 1120       Component Value Date/Time   WBC 5.8 05/12/2019 1120   RBC 4.48 05/12/2019 1120   HGB 13.3 05/12/2019 1120   HCT 40.8 05/12/2019 1120   PLT 232 05/12/2019 1120   MCV 91.1 05/12/2019 1120   MCH 29.7 05/12/2019 1120   MCHC 32.6 05/12/2019 1120   RDW 12.1 05/12/2019 1120   LYMPHSABS 1.8 05/12/2019 1120   MONOABS 0.4 05/12/2019 1120   EOSABS 0.1 05/12/2019 1120   BASOSABS 0.0 05/12/2019 1120    No results found for: "POCLITH", "LITHIUM"   No results found for: "PHENYTOIN", "PHENOBARB", "VALPROATE", "CBMZ"   .res Assessment: Plan:    Continue Prozac 40 mg daily for depression and anxiety.  Continue Dextroamphetamine 15 mg daily for ADHD.  Pt to follow-up in 6 months or sooner if clinically indicated.  Patient advised to contact office with any questions, adverse effects, or acute worsening in signs and symptoms.   [name] was seen today for follow-up.  Diagnoses and all orders for this visit:  Attention deficit hyperactivity disorder (ADHD), predominantly inattentive type -     dextroamphetamine (DEXTROSTAT) 10 MG tablet; Take 1.5 tablets (15 mg total) by mouth daily. -     dextroamphetamine (DEXTROSTAT) 10 MG tablet; Take 1.5 tablets (15 mg total) by mouth daily. -     dextroamphetamine (DEXTROSTAT) 10 MG tablet; Take 1.5 tablets (15 mg total) by mouth daily.  Generalized anxiety disorder  Recurrent major depressive disorder, in full remission [facility])     Please see After Visit Summary for patient specific instructions.  Future Appointments  Date Time Provider Department Center  02/03/2023  9:30 AM Corie Chiquito, PMHNP CP-CP None    No orders of  the defined types were placed in this encounter.   -------------------------------

## 2022-08-15 IMAGING — US US BREAST*L* LIMITED INC AXILLA
1 series · 8 of 8 positions shown · non-contrast
Comparison: None.

CLINICAL DATA: 25-year-old patient with a palpable lump in the
upper LEFT breast. Patient's mother had breast cancer at age 39.

EXAM:
ULTRASOUND OF THE LEFT BREAST

[Series 1: us breast*left* limited inc axilla · 0.07mm/px · 8 acquisitions, 8 frames shown]
[im 1/8]
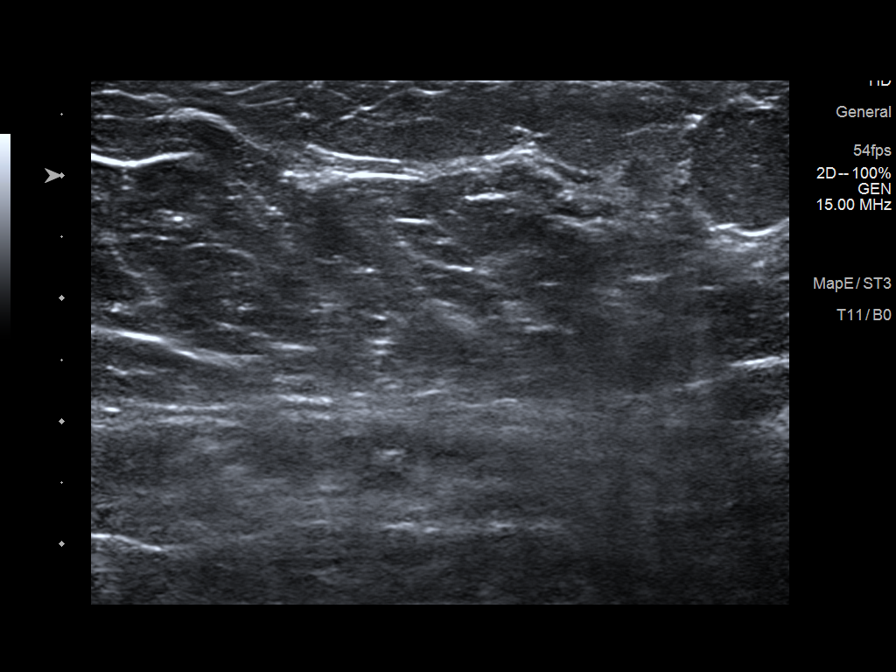
[im 2/8]
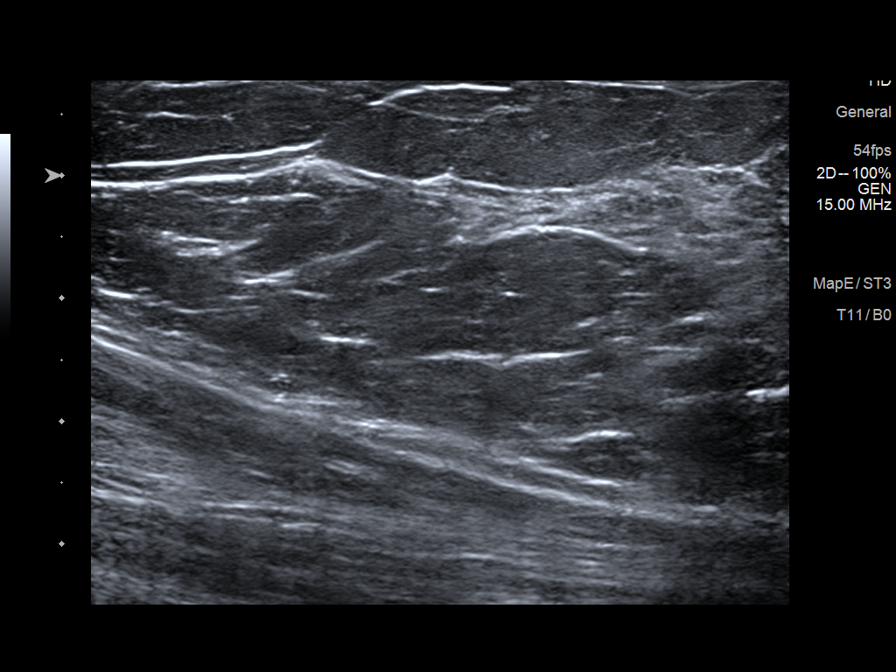
[im 3/8]
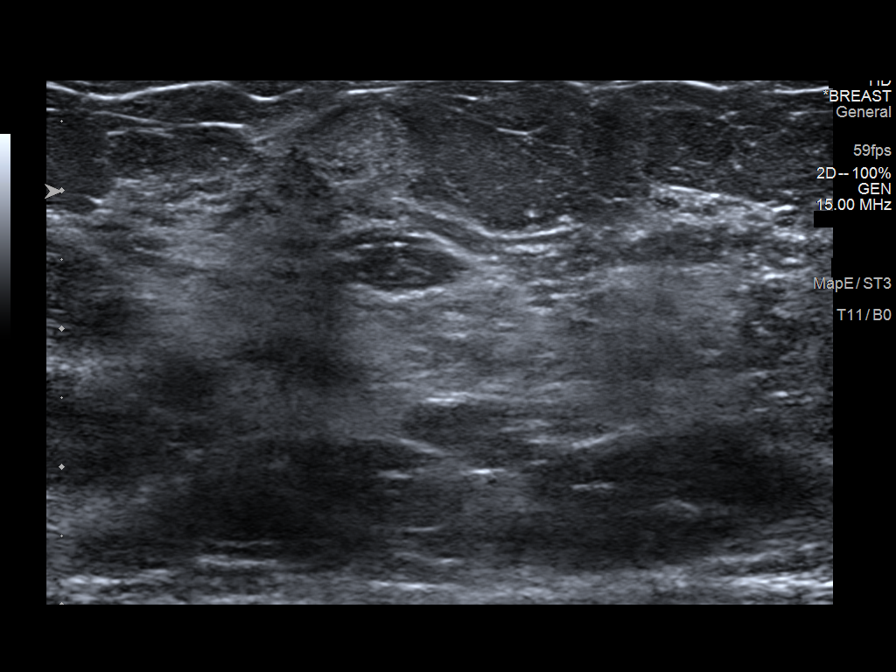
[im 4/8]
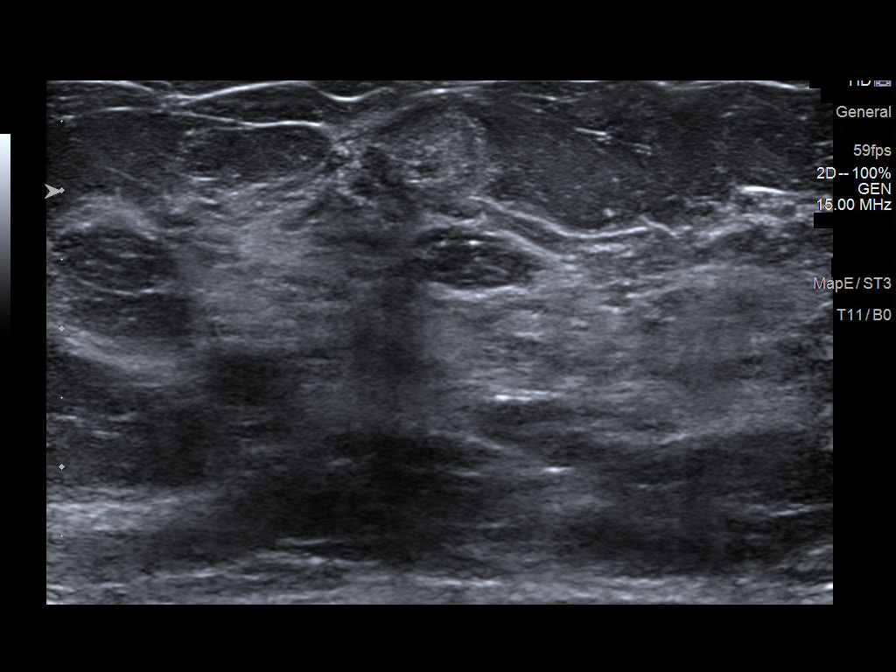
[im 5/8]
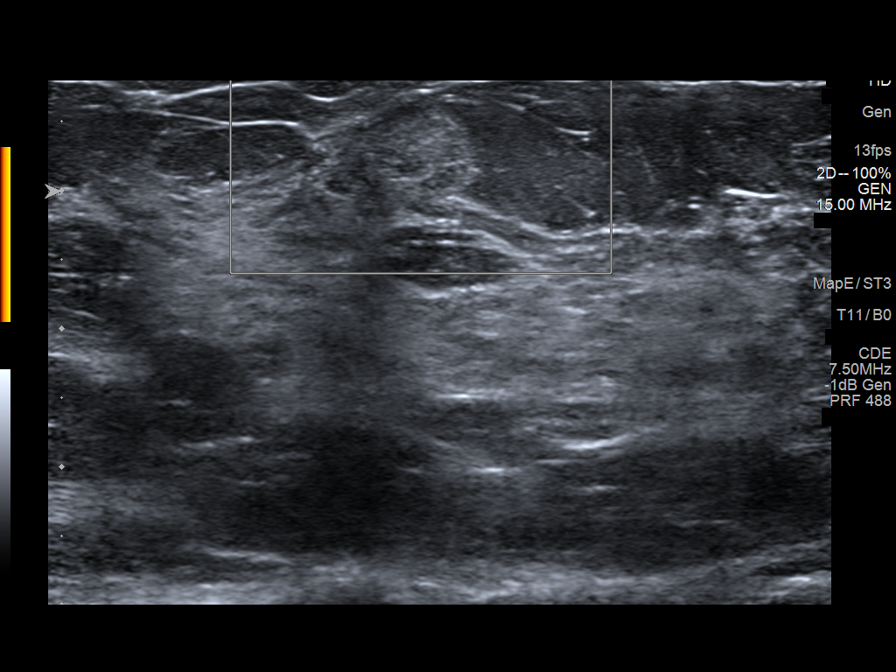
[im 6/8]
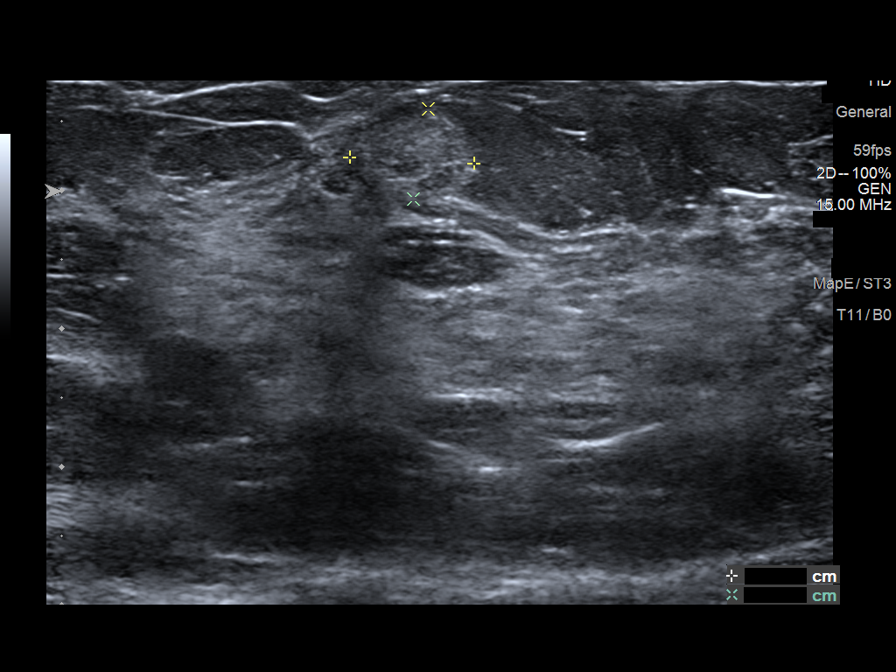
[im 7/8]
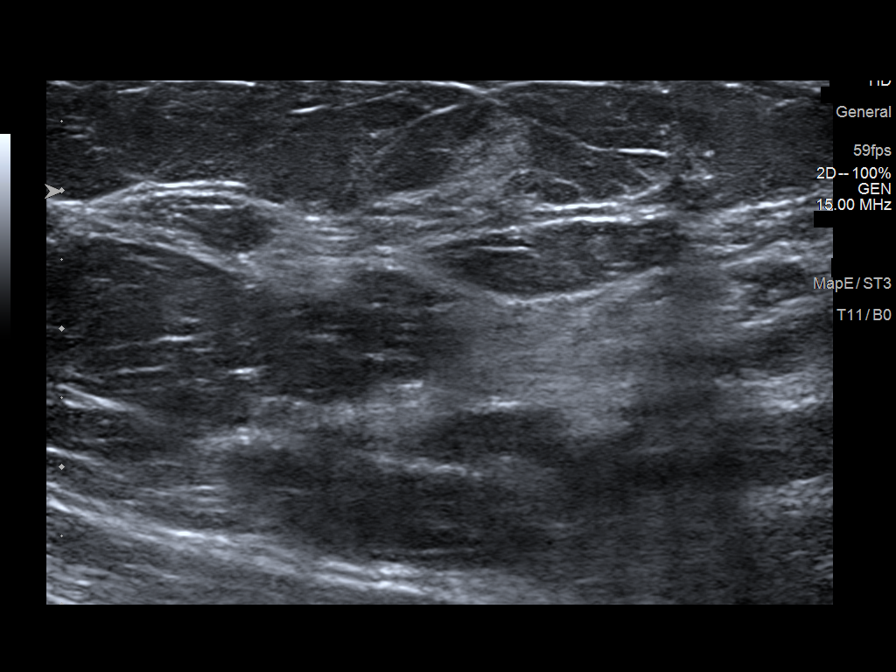
[im 8/8]
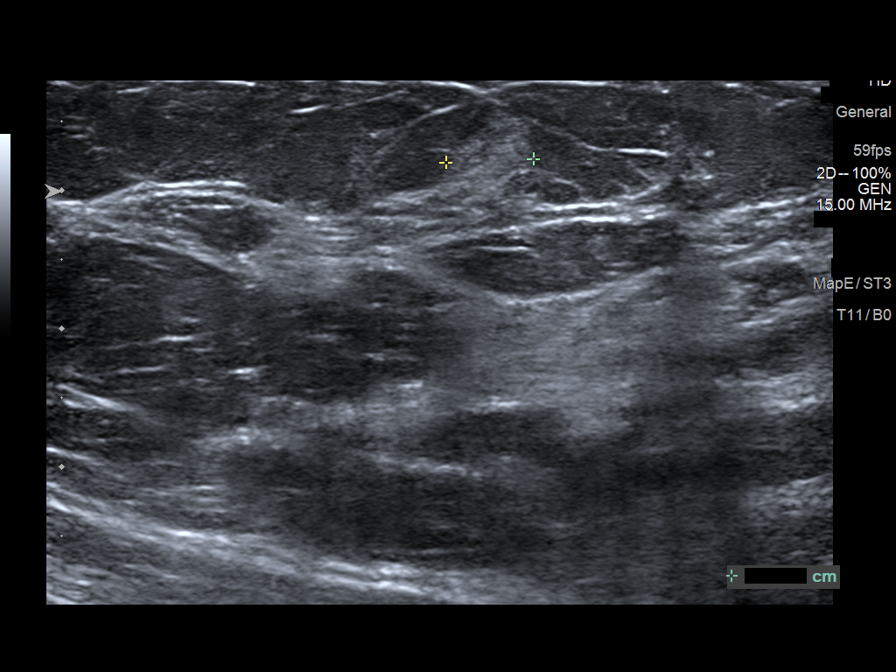

[8 of 8 positions shown; findings below may reference images not displayed]

FINDINGS: Targeted ultrasound is performed, evaluating the upper LEFT breast
as directed by the patient, showing only normal fibroglandular
tissues and fat lobules in the area of patient's clinical concern.
No solid or cystic mass.

There is an echogenic area within the LEFT breast at the 12 o'clock
axis, 6 cm from the nipple, measuring 9 mm, corresponding as
incidental finding away from the palpable area of concern, most
likely an island of normal fibroglandular tissue or benign
fibrocystic change.
IMPRESSION: 1. Probably benign island of normal fibroglandular tissue or
fibrocystic change within the LEFT breast at the 12 o'clock axis, 6
cm from the nipple, measuring 9 mm, corresponding as an incidental
finding away from the palpable area of concern. Recommend follow-up
LEFT breast ultrasound in 6 months to ensure stability.
2. No evidence of malignancy within the LEFT breast at the 12
o'clock axis, 7 cm from the nipple, corresponding to the area of
patient's concern.

RECOMMENDATION:
1. LEFT breast ultrasound in 6 months.
2. Given patient's mother's history of breast cancer at age 39,
consider commencement of a routine screening breast MRI schedule
starting at age 27 and routine annual screening mammography starting
at age 32.

I have discussed the findings and recommendations with the patient
and her mother. If applicable, a reminder letter will be sent to the
patient regarding the next appointment.

BI-RADS CATEGORY  3: Probably benign.

## 2022-11-25 ENCOUNTER — Encounter: Payer: Self-pay | Admitting: Psychiatry

## 2022-12-28 DIAGNOSIS — Z01419 Encounter for gynecological examination (general) (routine) without abnormal findings: Secondary | ICD-10-CM | POA: Diagnosis not present

## 2022-12-30 DIAGNOSIS — K529 Noninfective gastroenteritis and colitis, unspecified: Secondary | ICD-10-CM | POA: Diagnosis not present

## 2023-01-18 ENCOUNTER — Telehealth: Payer: Self-pay | Admitting: Physician Assistant

## 2023-01-18 NOTE — Telephone Encounter (Signed)
 Next visit is 02/05/23. Requesting refill

## 2023-01-20 ENCOUNTER — Other Ambulatory Visit: Payer: Self-pay

## 2023-01-20 DIAGNOSIS — F900 Attention-deficit hyperactivity disorder, predominantly inattentive type: Secondary | ICD-10-CM

## 2023-01-20 MED ORDER — DEXTROAMPHETAMINE SULFATE 10 MG PO TABS: 15.0000 mg | ORAL_TABLET | Freq: Every day | ORAL | 0 refills | Status: DC

## 2023-01-20 NOTE — Telephone Encounter (Signed)
 Patient lvm today requesting a refill on the Dextrostat. She stated she left 2 messages already. Pls advise.

## 2023-01-20 NOTE — Telephone Encounter (Signed)
 I see one message and it doesn't appear to have been routed to clinical.  Rx for Dextrostat pended.

## 2023-02-03 ENCOUNTER — Ambulatory Visit: Payer: BC Managed Care – PPO | Admitting: Psychiatry

## 2023-02-05 ENCOUNTER — Ambulatory Visit: Payer: BC Managed Care – PPO | Admitting: Physician Assistant

## 2023-02-22 ENCOUNTER — Telehealth: Payer: Self-pay | Admitting: Physician Assistant

## 2023-02-22 ENCOUNTER — Other Ambulatory Visit: Payer: Self-pay

## 2023-02-22 DIAGNOSIS — F900 Attention-deficit hyperactivity disorder, predominantly inattentive type: Secondary | ICD-10-CM

## 2023-02-22 MED ORDER — DEXTROAMPHETAMINE SULFATE 10 MG PO TABS: 15.0000 mg | ORAL_TABLET | Freq: Every day | ORAL | 0 refills | Status: DC

## 2023-02-22 NOTE — Telephone Encounter (Signed)
 Pended Dextrostat  to [facility] [address]

## 2023-02-22 NOTE — Telephone Encounter (Signed)
 Pt called 9:19 requesting refill  dextroamphetamine  10 mg tablet @ [facility] [address] Previous pt of [facility]. Has apt with [facility] 2/21

## 2023-03-05 ENCOUNTER — Ambulatory Visit: Payer: BC Managed Care – PPO | Admitting: Physician Assistant

## 2023-03-19 ENCOUNTER — Encounter: Payer: Self-pay | Admitting: Physician Assistant

## 2023-03-19 ENCOUNTER — Ambulatory Visit: Admitting: Physician Assistant

## 2023-03-19 DIAGNOSIS — F3342 Major depressive disorder, recurrent, in full remission: Secondary | ICD-10-CM

## 2023-03-19 DIAGNOSIS — F411 Generalized anxiety disorder: Secondary | ICD-10-CM

## 2023-03-19 DIAGNOSIS — F900 Attention-deficit hyperactivity disorder, predominantly inattentive type: Secondary | ICD-10-CM | POA: Diagnosis not present

## 2023-03-19 DIAGNOSIS — F429 Obsessive-compulsive disorder, unspecified: Secondary | ICD-10-CM | POA: Diagnosis not present

## 2023-03-19 MED ORDER — DEXTROAMPHETAMINE SULFATE 10 MG PO TABS
15.0000 mg | ORAL_TABLET | Freq: Every day | ORAL | 0 refills | Status: DC
Start: 2023-03-19 — End: 2023-06-23

## 2023-03-19 MED ORDER — FLUOXETINE HCL 40 MG PO CAPS: 40.0000 mg | ORAL_CAPSULE | Freq: Every day | ORAL | 1 refills | Status: DC

## 2023-03-19 MED ORDER — DEXTROAMPHETAMINE SULFATE 10 MG PO TABS: 15.0000 mg | ORAL_TABLET | Freq: Every day | ORAL | 0 refills | Status: DC

## 2023-03-19 MED ORDER — DEXTROAMPHETAMINE SULFATE 10 MG PO TABS
15.0000 mg | ORAL_TABLET | Freq: Every day | ORAL | 0 refills | Status: DC
Start: 2023-05-17 — End: 2023-08-05

## 2023-03-19 NOTE — Progress Notes (Signed)
 [facility]  Patient ID: Michaela Pena,  MRN: [id number]  PCP: Steva Ready, DO  Date of Evaluation: 03/19/2023 Time spent:20 minutes  Chief Complaint:  Chief Complaint   ADD; Anxiety; Follow-up    HISTORY/CURRENT STATUS: HPI transfer to my care from Corie Chiquito, NP who is no longer with our practice.  Dakiyah states she is doing well.  She feels like her medications are working as they should. States that attention is good without easy distractibility.  Able to focus on things and finish tasks to completion.  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  She has been with this family for approximately 3 years.  Patient is able to enjoy things.  She likes to read and does crafts.  Energy and motivation are good.  No extreme sadness, tearfulness, or feelings of hopelessness.  Sleeps well most of the time. ADLs and personal hygiene are normal.    Appetite has not changed.  Weight is stable.  Denies laxative use, calorie restricting, or binging and purging.   Denies cutting or any form of self-harm.  No anxiety reported.  She has not taken the Xanax probably in 6 months.  Denies suicidal or homicidal thoughts.  Patient denies increased energy with decreased need for sleep, increased talkativeness, racing thoughts, impulsivity or risky behaviors, increased spending, increased libido, grandiosity, increased irritability or anger, paranoia, or hallucinations.  Denies dizziness, syncope, seizures, numbness, tingling, tremor, tics, unsteady gait, slurred speech, confusion. Denies muscle or joint pain, stiffness, or dystonia.  Individual Medical History/ Review of Systems: Changes? :No   Past Psychiatric Medication Trials: Wellbutrin XL Sertraline Prozac- Effective Adderall XR- Effective, however unable to tolerate due to n/v Adderall immediate release- side effects Evekeo Dextroamphetamine Alprazolam-effective Trazodone- caused nightmares in  the past. Effective. Hydroxyzine- ineffective  Doxepin  Allergies: Patient has no known allergies.  Current Medications:  Current Outpatient Medications:    ALPRAZolam (XANAX) 0.25 MG tablet, Take 1/2-2 tabs po qd prn anxiety (Patient not taking: Reported on 03/19/2023), Disp: 30 tablet, Rfl: 1   dextroamphetamine (DEXTROSTAT) 10 MG tablet, Take 1.5 tablets (15 mg total) by mouth daily., Disp: 45 tablet, Rfl: 0   [START ON 04/18/2023] dextroamphetamine (DEXTROSTAT) 10 MG tablet, Take 1.5 tablets (15 mg total) by mouth daily., Disp: 45 tablet, Rfl: 0   [START ON 05/17/2023] dextroamphetamine (DEXTROSTAT) 10 MG tablet, Take 1.5 tablets (15 mg total) by mouth daily., Disp: 45 tablet, Rfl: 0   FLUoxetine (PROZAC) 40 MG capsule, Take 1 capsule (40 mg total) by mouth daily., Disp: 90 capsule, Rfl: 1   levothyroxine (SYNTHROID) 25 MCG tablet, Take 25 mcg by mouth daily before breakfast. (Patient not taking: Reported on 03/19/2023), Disp: , Rfl:  Medication Side Effects: none  Family Medical/ Social History: Changes? No   MENTAL HEALTH EXAM:  There were no vitals taken for this visit.There is no height or weight on file to calculate BMI.  General Appearance: Casual and Well Groomed  Eye Contact:  Good  Speech:  Clear and Coherent and Normal Rate  Volume:  Normal  Mood:  Euthymic  Affect:  Congruent  Thought Process:  Goal Directed and Descriptions of Associations: Circumstantial  Orientation:  Full (Time, Place, and Person)  Thought Content: Logical   Suicidal Thoughts:  No  Homicidal Thoughts:  No  Memory:  WNL  Judgement:  Good  Insight:  Good  Psychomotor Activity:  Normal  Concentration:  Concentration: Good  Recall:  Good  Fund of  Knowledge: Good  Language: Good  Assets:  Communication Skills Desire for Improvement Financial Resources/Insurance Housing Transportation Vocational/Educational  ADL's:  Intact  Cognition: WNL  Prognosis:  Good   DIAGNOSES:    ICD-10-CM   1.  Attention deficit hyperactivity disorder (ADHD), predominantly inattentive type  F90.0 dextroamphetamine (DEXTROSTAT) 10 MG tablet    dextroamphetamine (DEXTROSTAT) 10 MG tablet    dextroamphetamine (DEXTROSTAT) 10 MG tablet    2. Generalized anxiety disorder  F41.1 FLUoxetine (PROZAC) 40 MG capsule    3. Recurrent major depressive disorder, in full remission (HCC)  F33.42 FLUoxetine (PROZAC) 40 MG capsule    4. Obsessive-compulsive disorder, unspecified type  F42.9 FLUoxetine (PROZAC) 40 MG capsule     Receiving Psychotherapy: No   RECOMMENDATIONS:   PDMP reviewed. Dextrostat filled 02/22/2023. I provided 20 minutes of face to face time during this encounter, including time spent before and after the visit in records review, medical decision making, counseling pertinent to today's visit, and charting.   She is doing well with current treatment so no changes are necessary.  Continue Dextrostat 10 mg, 1.5 pills daily. Continue Prozac 40 mg, 1 p.o. daily. Return in 6 months.  Melony Overly, PA-C

## 2023-04-15 ENCOUNTER — Ambulatory Visit: Payer: BC Managed Care – PPO | Admitting: Physician Assistant

## 2023-06-22 ENCOUNTER — Telehealth: Payer: Self-pay | Admitting: Psychiatry

## 2023-06-22 NOTE — Telephone Encounter (Signed)
 LF 5/20, due 6/17

## 2023-06-22 NOTE — Telephone Encounter (Signed)
 Next appt is 09/21/03. Requesting refill on dextroamphetamine  15 mg called to:  [id number] [place], [place] - [address] AT [facility] [address] [facility] [address]   Phone: [phone]  Fax: [phone]

## 2023-06-23 ENCOUNTER — Other Ambulatory Visit: Payer: Self-pay

## 2023-06-23 DIAGNOSIS — F900 Attention-deficit hyperactivity disorder, predominantly inattentive type: Secondary | ICD-10-CM

## 2023-06-23 MED ORDER — DEXTROAMPHETAMINE SULFATE 10 MG PO TABS: 15.0000 mg | ORAL_TABLET | Freq: Every day | ORAL | 0 refills | Status: DC

## 2023-06-23 NOTE — Telephone Encounter (Signed)
 Pt called back at 11:44a.  She said she advised yesterday she was needing medication early because she's going on vacation.  She leaves at 5am on tomorrow morning, so she needs it sent in today.

## 2023-06-23 NOTE — Telephone Encounter (Signed)
 Called pharmacy to FU on request for an early RF.  They said they would fill it, but not sure insurance will cover it. It will also push out her RF next month until 7/20.  Notified patient.

## 2023-06-23 NOTE — Telephone Encounter (Signed)
 Message received yesterday doesn't mention needing an early RF. Have pended to [facility] for her review. It is a week early.

## 2023-08-04 ENCOUNTER — Telehealth: Payer: Self-pay | Admitting: Physician Assistant

## 2023-08-04 NOTE — Telephone Encounter (Signed)
 Pt LVM @ 3:30p requesting a refill Dextroamphetamine  to   [id number] - [place], [place] - [address] AT [facility] [address], [place] [address] Phone: [phone]  Fax: [phone]   Next appt 9/9

## 2023-08-05 ENCOUNTER — Other Ambulatory Visit: Payer: Self-pay

## 2023-08-05 DIAGNOSIS — F900 Attention-deficit hyperactivity disorder, predominantly inattentive type: Secondary | ICD-10-CM

## 2023-08-05 MED ORDER — DEXTROAMPHETAMINE SULFATE 10 MG PO TABS: 15.0000 mg | ORAL_TABLET | Freq: Every day | ORAL | 0 refills | Status: DC

## 2023-08-05 MED ORDER — DEXTROAMPHETAMINE SULFATE 10 MG PO TABS
15.0000 mg | ORAL_TABLET | Freq: Every day | ORAL | 0 refills | Status: DC
Start: 2023-09-03 — End: 2023-11-12

## 2023-08-05 NOTE — Telephone Encounter (Signed)
 Pended.

## 2023-09-21 ENCOUNTER — Ambulatory Visit: Admitting: Physician Assistant

## 2023-10-07 ENCOUNTER — Telehealth: Payer: Self-pay | Admitting: Physician Assistant

## 2023-10-07 NOTE — Telephone Encounter (Signed)
 Pt lvm requesting Rx Dextroamphetamine  10 mg tablets  [facility] [address]   [address]

## 2023-10-08 ENCOUNTER — Other Ambulatory Visit: Payer: Self-pay

## 2023-10-08 DIAGNOSIS — F900 Attention-deficit hyperactivity disorder, predominantly inattentive type: Secondary | ICD-10-CM

## 2023-10-08 MED ORDER — DEXTROAMPHETAMINE SULFATE 10 MG PO TABS: 15.0000 mg | ORAL_TABLET | Freq: Every day | ORAL | 0 refills | Status: DC

## 2023-10-08 NOTE — Telephone Encounter (Signed)
 Pended

## 2023-10-18 ENCOUNTER — Ambulatory Visit: Admitting: Physician Assistant

## 2023-11-06 ENCOUNTER — Other Ambulatory Visit: Payer: Self-pay | Admitting: Physician Assistant

## 2023-11-06 DIAGNOSIS — F411 Generalized anxiety disorder: Secondary | ICD-10-CM

## 2023-11-06 DIAGNOSIS — F429 Obsessive-compulsive disorder, unspecified: Secondary | ICD-10-CM

## 2023-11-06 DIAGNOSIS — F3342 Major depressive disorder, recurrent, in full remission: Secondary | ICD-10-CM

## 2023-11-12 ENCOUNTER — Ambulatory Visit: Admitting: Physician Assistant

## 2023-11-12 ENCOUNTER — Encounter: Payer: Self-pay | Admitting: Physician Assistant

## 2023-11-12 ENCOUNTER — Telehealth: Payer: Self-pay

## 2023-11-12 DIAGNOSIS — F411 Generalized anxiety disorder: Secondary | ICD-10-CM | POA: Diagnosis not present

## 2023-11-12 DIAGNOSIS — F429 Obsessive-compulsive disorder, unspecified: Secondary | ICD-10-CM

## 2023-11-12 DIAGNOSIS — F900 Attention-deficit hyperactivity disorder, predominantly inattentive type: Secondary | ICD-10-CM | POA: Diagnosis not present

## 2023-11-12 DIAGNOSIS — F3342 Major depressive disorder, recurrent, in full remission: Secondary | ICD-10-CM | POA: Diagnosis not present

## 2023-11-12 MED ORDER — LISDEXAMFETAMINE DIMESYLATE 30 MG PO CAPS: 30.0000 mg | ORAL_CAPSULE | Freq: Every day | ORAL | 0 refills | Status: DC

## 2023-11-12 MED ORDER — DEXTROAMPHETAMINE SULFATE 10 MG PO TABS: 15.0000 mg | ORAL_TABLET | Freq: Every day | ORAL | 0 refills | Status: DC

## 2023-11-12 NOTE — Telephone Encounter (Signed)
 Submitted PA for Lisdexamfetamine 30 mg capsules with [employer] but No PA required.

## 2023-11-12 NOTE — Progress Notes (Signed)
 [facility]  Patient ID: Michaela Pena,  MRN: [id number]  PCP: Storm Setter, DO  Date of Evaluation: 11/12/2023 Time spent:20 minutes  Chief Complaint:  Chief Complaint   ADHD; Follow-up    HISTORY/CURRENT STATUS: HPI for 67-month med check.  Michaela Pena continues to do well as far as the depression and anxiety go. Patient is able to enjoy things.  She enjoys crafting.  Energy and motivation are good.   No extreme sadness, tearfulness, or feelings of hopelessness.  Sleeps well most of the time. ADLs and personal hygiene are normal.   Appetite has not changed.  Anxiety is well-controlled.  No complaints of obsessions or compulsions at this time.  No mania, delirium, AH/VH.  No SI/HI.  The Dextrostat  is not working as well as it used to.  She needs something that lasts longer.  It is still effective just wears off within a few hours.  Individual Medical History/ Review of Systems: Changes? :No   Past Psychiatric Medication Trials: Wellbutrin  XL Sertraline  Prozac - Effective Adderall XR- Effective, however unable to tolerate due to n/v Adderall immediate release- side effects Evekeo  Dextroamphetamine  Alprazolam -effective Trazodone - caused nightmares in the past. Effective. Hydroxyzine - ineffective  Doxepin   Allergies: Patient has no known allergies.  Current Medications:  Current Outpatient Medications:    FLUoxetine  (PROZAC ) 40 MG capsule, TAKE 1 CAPSULE (40 MG TOTAL) BY MOUTH DAILY., Disp: 90 capsule, Rfl: 1   lisdexamfetamine (VYVANSE) 30 MG capsule, Take 1 capsule (30 mg total) by mouth daily after breakfast., Disp: 30 capsule, Rfl: 0   ALPRAZolam  (XANAX ) 0.25 MG tablet, Take 1/2-2 tabs po qd prn anxiety (Patient not taking: Reported on 11/12/2023), Disp: 30 tablet, Rfl: 1   levothyroxine (SYNTHROID) 25 MCG tablet, Take 25 mcg by mouth daily before breakfast. (Patient not taking: Reported on 11/12/2023), Disp: , Rfl:  Medication Side Effects:  none  Family Medical/ Social History: Changes? No   MENTAL HEALTH EXAM:  There were no vitals taken for this visit.There is no height or weight on file to calculate BMI.  General Appearance: Casual and Well Groomed  Eye Contact:  Good  Speech:  Clear and Coherent and Normal Rate  Volume:  Normal  Mood:  Euthymic  Affect:  Congruent  Thought Process:  Goal Directed and Descriptions of Associations: Circumstantial  Orientation:  Full (Time, Place, and Person)  Thought Content: Logical   Suicidal Thoughts:  No  Homicidal Thoughts:  No  Memory:  WNL  Judgement:  Good  Insight:  Good  Psychomotor Activity:  Normal  Concentration:  Concentration: Fair and Attention Span: Good  Recall:  Good  Fund of Knowledge: Good  Language: Good  Assets:  Communication Skills Desire for Improvement Financial Resources/Insurance Housing Transportation Vocational/Educational  ADL's:  Intact  Cognition: WNL  Prognosis:  Good   DIAGNOSES:    ICD-10-CM   1. Attention deficit hyperactivity disorder (ADHD), predominantly inattentive type  F90.0 DISCONTINUED: dextroamphetamine  (DEXTROSTAT ) 10 MG tablet    2. Generalized anxiety disorder  F41.1     3. Recurrent major depressive disorder, in full remission  F33.42     4. Obsessive-compulsive disorder, unspecified type  F42.9      Receiving Psychotherapy: No   RECOMMENDATIONS:   PDMP reviewed. Dextrostat  filled 10/11/2023. I provided approximately  20 minutes of face to face time during this encounter, including time spent before and after the visit in records review, medical decision making, counseling pertinent to today's visit, and charting.   We discussed options for long-acting  stimulants.  I recommend changing to Vyvanse.  Benefits, risk and side effects were discussed and she accepts.  Discontinue Dextrostat .   Continue Prozac  40 mg, 1 p.o. daily. Start Vyvanse 30 mg, 1 p.o. every morning. Return in 6 weeks.  Verneita Cooks, PA-C

## 2023-11-15 NOTE — Telephone Encounter (Signed)
 Noted

## 2023-12-13 ENCOUNTER — Telehealth: Payer: Self-pay | Admitting: Physician Assistant

## 2023-12-13 ENCOUNTER — Other Ambulatory Visit: Payer: Self-pay

## 2023-12-13 DIAGNOSIS — F900 Attention-deficit hyperactivity disorder, predominantly inattentive type: Secondary | ICD-10-CM

## 2023-12-13 NOTE — Telephone Encounter (Signed)
 Pended

## 2023-12-13 NOTE — Telephone Encounter (Signed)
 Michaela Pena lvm that she needs a refill of her vyvanse  30 mg. [facility] [address]

## 2023-12-14 MED ORDER — LISDEXAMFETAMINE DIMESYLATE 30 MG PO CAPS: 30.0000 mg | ORAL_CAPSULE | Freq: Every day | ORAL | 0 refills | Status: DC

## 2023-12-27 ENCOUNTER — Telehealth: Admitting: Physician Assistant

## 2023-12-27 ENCOUNTER — Encounter: Payer: Self-pay | Admitting: Physician Assistant

## 2023-12-27 DIAGNOSIS — F411 Generalized anxiety disorder: Secondary | ICD-10-CM

## 2023-12-27 DIAGNOSIS — F3342 Major depressive disorder, recurrent, in full remission: Secondary | ICD-10-CM

## 2023-12-27 DIAGNOSIS — F900 Attention-deficit hyperactivity disorder, predominantly inattentive type: Secondary | ICD-10-CM | POA: Diagnosis not present

## 2023-12-27 MED ORDER — LISDEXAMFETAMINE DIMESYLATE 40 MG PO CAPS: 40.0000 mg | ORAL_CAPSULE | ORAL | 0 refills | Status: AC

## 2023-12-27 NOTE — Progress Notes (Signed)
 [facility]  Patient ID: Michaela Pena,  MRN: [id number]  PCP: Storm Setter, DO  Date of Evaluation: 12/27/2023 Time spent:20 minutes  Chief Complaint:  Chief Complaint   Depression; ADD; Follow-up   Virtual Visit via Telehealth  I connected with patient by a video enabled telemedicine application with their informed consent, and verified patient privacy and that I am speaking with the correct person using two identifiers.  I am private, in my office and the patient is at work.  I discussed the limitations, risks, security and privacy concerns of performing an evaluation and management service by video and the availability of in person appointments. I also discussed with the patient that there may be a patient responsible charge related to this service. The patient expressed understanding and agreed to proceed.   I discussed the assessment and treatment plan with the patient. The patient was provided an opportunity to ask questions and all were answered. The patient agreed with the plan and demonstrated an understanding of the instructions.   The patient was advised to call back or seek an in-person evaluation if the symptoms worsen or if the condition fails to improve as anticipated.  I provided approximately 20  minutes of non-face-to-face time during this encounter.  HISTORY/CURRENT STATUS: HPI for 52-month med check.  We changed Dextrostat  to Vyvanse  at the last visit.  It has been working much better, however she feels like there is room for improvement.  She still has some trouble focusing and staying on task.  Other than that she is doing well.  No symptoms of depression, no suicidal or homicidal thoughts, no mania, delirium, or psychosis, and no complaints of anxiety.    Individual Medical History/ Review of Systems: Changes? :No   Past Psychiatric Medication Trials: Wellbutrin  XL Sertraline  Prozac - Effective Adderall XR- Effective, however unable  to tolerate due to n/v Adderall immediate release- side effects Evekeo  Dextroamphetamine  Alprazolam -effective Trazodone - caused nightmares in the past. Effective. Hydroxyzine - ineffective  Doxepin   Allergies: Patient has no known allergies.  Current Medications:  Current Outpatient Medications:    FLUoxetine  (PROZAC ) 40 MG capsule, TAKE 1 CAPSULE (40 MG TOTAL) BY MOUTH DAILY., Disp: 90 capsule, Rfl: 1   [START ON 01/11/2024] lisdexamfetamine  (VYVANSE ) 40 MG capsule, Take 1 capsule (40 mg total) by mouth every morning., Disp: 30 capsule, Rfl: 0   ALPRAZolam  (XANAX ) 0.25 MG tablet, Take 1/2-2 tabs po qd prn anxiety (Patient not taking: Reported on 12/27/2023), Disp: 30 tablet, Rfl: 1   levothyroxine (SYNTHROID) 25 MCG tablet, Take 25 mcg by mouth daily before breakfast. (Patient not taking: Reported on 12/27/2023), Disp: , Rfl:  Medication Side Effects: none  Family Medical/ Social History: Changes? No   MENTAL HEALTH EXAM:  There were no vitals taken for this visit.There is no height or weight on file to calculate BMI.  General Appearance: Casual and Well Groomed  Eye Contact:  Good  Speech:  Clear and Coherent and Normal Rate  Volume:  Normal  Mood:  Euthymic  Affect:  Congruent  Thought Process:  Goal Directed and Descriptions of Associations: Circumstantial  Orientation:  Full (Time, Place, and Person)  Thought Content: Logical   Suicidal Thoughts:  No  Homicidal Thoughts:  No  Memory:  WNL  Judgement:  Good  Insight:  Good  Psychomotor Activity:  Normal  Concentration:  Concentration: Fair and Attention Span: Good  Recall:  Good  Fund of Knowledge: Good  Language: Good  Assets:  Communication Skills Desire for Improvement  Financial Mining Engineer  ADL's:  Intact  Cognition: WNL  Prognosis:  Good   DIAGNOSES:    ICD-10-CM   1. Attention deficit hyperactivity disorder (ADHD), predominantly  inattentive type  F90.0     2. Recurrent major depressive disorder, in full remission  F33.42     3. Generalized anxiety disorder  F41.1      Receiving Psychotherapy: No   RECOMMENDATIONS:   PDMP reviewed.  Vyvanse  filled 12/14/2023. I provided approximately 20 minutes of non-face-to-face time during this encounter, including time spent before and after the visit in records review, medical decision making, counseling pertinent to today's visit, and charting.   We discussed the Vyvanse .  I recommend increasing the dose.  She will contact the office approximately 1 week before she runs out, let me know how effective it is and I will send in more prescriptions as appropriate.  Continue Prozac  40 mg, 1 p.o. daily. Increase  Vyvanse  to 40 mg, 1 p.o. every morning. Return in 4 months.  Verneita Cooks, PA-C
